# Patient Record
Sex: Male | Born: 1980 | Race: White | Hispanic: No | Marital: Married | Smoking: Never smoker
Health system: Southern US, Community
[De-identification: ages and names within clinical notes are randomized; demographics above are authoritative.]

## PROBLEM LIST (undated history)

## (undated) DIAGNOSIS — K219 Gastro-esophageal reflux disease without esophagitis: Secondary | ICD-10-CM

## (undated) DIAGNOSIS — S82141A Displaced bicondylar fracture of right tibia, initial encounter for closed fracture: Secondary | ICD-10-CM

## (undated) DIAGNOSIS — F419 Anxiety disorder, unspecified: Secondary | ICD-10-CM

## (undated) HISTORY — PX: TONSILLECTOMY AND ADENOIDECTOMY: SHX28

---

## 2015-08-30 ENCOUNTER — Emergency Department (HOSPITAL_COMMUNITY): Payer: Self-pay

## 2015-08-30 ENCOUNTER — Encounter (HOSPITAL_COMMUNITY): Payer: Self-pay

## 2015-08-30 ENCOUNTER — Inpatient Hospital Stay (HOSPITAL_COMMUNITY)
Admission: EM | Admit: 2015-08-30 | Discharge: 2015-09-06 | DRG: 494 | Disposition: A | Discharge status: Home Health | Payer: Self-pay | Attending: Orthopedic Surgery | Admitting: Orthopedic Surgery

## 2015-08-30 DIAGNOSIS — Z419 Encounter for procedure for purposes other than remedying health state, unspecified: Secondary | ICD-10-CM

## 2015-08-30 DIAGNOSIS — S8991XA Unspecified injury of right lower leg, initial encounter: Secondary | ICD-10-CM

## 2015-08-30 DIAGNOSIS — S82141A Displaced bicondylar fracture of right tibia, initial encounter for closed fracture: Principal | ICD-10-CM

## 2015-08-30 DIAGNOSIS — Z01818 Encounter for other preprocedural examination: Secondary | ICD-10-CM

## 2015-08-30 DIAGNOSIS — S82143A Displaced bicondylar fracture of unspecified tibia, initial encounter for closed fracture: Secondary | ICD-10-CM | POA: Diagnosis present

## 2015-08-30 DIAGNOSIS — K219 Gastro-esophageal reflux disease without esophagitis: Secondary | ICD-10-CM | POA: Diagnosis present

## 2015-08-30 DIAGNOSIS — F419 Anxiety disorder, unspecified: Secondary | ICD-10-CM | POA: Diagnosis present

## 2015-08-30 DIAGNOSIS — S82131A Displaced fracture of medial condyle of right tibia, initial encounter for closed fracture: Secondary | ICD-10-CM | POA: Diagnosis present

## 2015-08-30 DIAGNOSIS — F17290 Nicotine dependence, other tobacco product, uncomplicated: Secondary | ICD-10-CM | POA: Diagnosis present

## 2015-08-30 HISTORY — DX: Gastro-esophageal reflux disease without esophagitis: K21.9

## 2015-08-30 HISTORY — DX: Displaced bicondylar fracture of right tibia, initial encounter for closed fracture: S82.141A

## 2015-08-30 HISTORY — DX: Anxiety disorder, unspecified: F41.9

## 2015-08-30 LAB — BASIC METABOLIC PANEL
ANION GAP: 11 (ref 5–15)
BUN: 22 mg/dL — AB (ref 6–20)
CHLORIDE: 105 mmol/L (ref 101–111)
CO2: 25 mmol/L (ref 22–32)
Calcium: 9.3 mg/dL (ref 8.9–10.3)
Creatinine, Ser: 1.07 mg/dL (ref 0.61–1.24)
GFR calc Af Amer: >60 mL/min (ref >60)
GFR calc non Af Amer: >60 mL/min (ref >60)
GLUCOSE: 117 mg/dL — AB (ref 65–99)
POTASSIUM: 3.8 mmol/L (ref 3.5–5.1)
Sodium: 141 mmol/L (ref 135–145)

## 2015-08-30 LAB — CBC WITH DIFFERENTIAL/PLATELET
Basophils Absolute: 0 10*3/uL (ref 0.0–0.1)
Basophils Relative: 0 %
Eosinophils Absolute: 0 10*3/uL (ref 0.0–0.7)
Eosinophils Relative: 0 %
HEMATOCRIT: 41.3 % (ref 39.0–52.0)
HEMOGLOBIN: 14.7 g/dL (ref 13.0–17.0)
LYMPHS ABS: 1 10*3/uL (ref 0.7–4.0)
LYMPHS PCT: 8 %
MCH: 30.1 pg (ref 26.0–34.0)
MCHC: 35.6 g/dL (ref 30.0–36.0)
MCV: 84.6 fL (ref 78.0–100.0)
Monocytes Absolute: 0.5 10*3/uL (ref 0.1–1.0)
Monocytes Relative: 4 %
NEUTROS ABS: 10.8 10*3/uL — AB (ref 1.7–7.7)
NEUTROS PCT: 88 %
Platelets: 234 10*3/uL (ref 150–400)
RBC: 4.88 MIL/uL (ref 4.22–5.81)
RDW: 11.8 % (ref 11.5–15.5)
WBC: 12.2 10*3/uL — AB (ref 4.0–10.5)

## 2015-08-30 MED ORDER — OXYCODONE HCL 5 MG PO TABS
5.0000 mg | ORAL_TABLET | ORAL | Status: DC | PRN
  Administered 2015-08-31: 10 mg via ORAL
  Administered 2015-08-31 (×2): 5 mg via ORAL
  Administered 2015-08-31: 10 mg via ORAL
  Administered 2015-09-01 (×2): 5 mg via ORAL
  Administered 2015-09-01 – 2015-09-06 (×21): 10 mg via ORAL
  Filled 2015-08-30 (×3): qty 2
  Filled 2015-08-30: qty 1
  Filled 2015-08-30 (×5): qty 2
  Filled 2015-08-30 (×2): qty 1
  Filled 2015-08-30 (×12): qty 2
  Filled 2015-08-30 (×2): qty 1
  Filled 2015-08-30 (×2): qty 2
  Filled 2015-08-30: qty 1
  Filled 2015-08-30: qty 2

## 2015-08-30 MED ORDER — HYDROMORPHONE HCL 1 MG/ML IJ SOLN
0.5000 mg | INTRAMUSCULAR | Status: DC | PRN
  Filled 2015-08-30: qty 1

## 2015-08-30 MED ORDER — ONDANSETRON HCL 4 MG/2ML IJ SOLN
4.0000 mg | Freq: Once | INTRAMUSCULAR | Status: AC | PRN
  Administered 2015-08-30: 4 mg via INTRAVENOUS
  Filled 2015-08-30: qty 2

## 2015-08-30 MED ORDER — MORPHINE SULFATE (PF) 4 MG/ML IV SOLN
4.0000 mg | Freq: Once | INTRAVENOUS | Status: AC
  Administered 2015-08-30: 4 mg via INTRAVENOUS
  Filled 2015-08-30: qty 1

## 2015-08-30 MED ORDER — ACETAMINOPHEN 650 MG RE SUPP: 650.0000 mg | Freq: Four times a day (QID) | RECTAL | Status: DC | PRN

## 2015-08-30 MED ORDER — SODIUM CHLORIDE 0.9 % IV SOLN
INTRAVENOUS | Status: DC
  Administered 2015-08-30: 22:00:00 via INTRAVENOUS

## 2015-08-30 MED ORDER — HYDROMORPHONE HCL 1 MG/ML IJ SOLN
1.0000 mg | INTRAMUSCULAR | Status: DC | PRN
  Administered 2015-08-30 – 2015-08-31 (×3): 1 mg via INTRAVENOUS
  Filled 2015-08-30 (×3): qty 1

## 2015-08-30 MED ORDER — DOCUSATE SODIUM 100 MG PO CAPS
100.0000 mg | ORAL_CAPSULE | Freq: Two times a day (BID) | ORAL | Status: DC
  Administered 2015-08-30 – 2015-08-31 (×2): 100 mg via ORAL
  Filled 2015-08-30 (×2): qty 1

## 2015-08-30 MED ORDER — NICOTINE 7 MG/24HR TD PT24
7.0000 mg | MEDICATED_PATCH | Freq: Every day | TRANSDERMAL | Status: DC
  Administered 2015-08-30: 7 mg via TRANSDERMAL
  Filled 2015-08-30 (×2): qty 1

## 2015-08-30 MED ORDER — ONDANSETRON HCL 4 MG/2ML IJ SOLN
4.0000 mg | Freq: Three times a day (TID) | INTRAMUSCULAR | Status: DC | PRN
  Administered 2015-08-31: 4 mg via INTRAVENOUS
  Filled 2015-08-30: qty 2

## 2015-08-30 MED ORDER — MORPHINE SULFATE (PF) 2 MG/ML IV SOLN
2.0000 mg | Freq: Once | INTRAVENOUS | Status: AC
  Administered 2015-08-30: 2 mg via INTRAVENOUS
  Filled 2015-08-30: qty 1

## 2015-08-30 MED ORDER — ACETAMINOPHEN 325 MG PO TABS: 650.0000 mg | ORAL_TABLET | Freq: Four times a day (QID) | ORAL | Status: DC | PRN

## 2015-08-30 MED ORDER — SENNA 8.6 MG PO TABS
1.0000 | ORAL_TABLET | Freq: Two times a day (BID) | ORAL | Status: DC
  Administered 2015-08-30 – 2015-09-06 (×12): 8.6 mg via ORAL
  Filled 2015-08-30 (×12): qty 1

## 2015-08-30 NOTE — ED Notes (Signed)
[facility]
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
## 2015-08-31 ENCOUNTER — Encounter (HOSPITAL_COMMUNITY): Payer: Self-pay | Admitting: Orthopedic Surgery

## 2015-08-31 DIAGNOSIS — S82143A Displaced bicondylar fracture of unspecified tibia, initial encounter for closed fracture: Secondary | ICD-10-CM | POA: Diagnosis present

## 2015-08-31 LAB — CREATININE, SERUM
Creatinine, Ser: 0.99 mg/dL (ref 0.61–1.24)
GFR calc non Af Amer: >60 mL/min (ref >60)

## 2015-08-31 LAB — ABO/RH: ABO/RH(D): A POS

## 2015-08-31 LAB — CBC
HEMATOCRIT: 40.4 % (ref 39.0–52.0)
HEMATOCRIT: 40.1 % (ref 39.0–52.0)
HEMOGLOBIN: 13.4 g/dL (ref 13.0–17.0)
Hemoglobin: 13.3 g/dL (ref 13.0–17.0)
MCH: 29.3 pg (ref 26.0–34.0)
MCH: 29.2 pg (ref 26.0–34.0)
MCHC: 33.2 g/dL (ref 30.0–36.0)
MCHC: 33.2 g/dL (ref 30.0–36.0)
MCV: 88.2 fL (ref 78.0–100.0)
MCV: 87.9 fL (ref 78.0–100.0)
Platelets: 208 10*3/uL (ref 150–400)
Platelets: 185 10*3/uL (ref 150–400)
RBC: 4.58 MIL/uL (ref 4.22–5.81)
RBC: 4.56 MIL/uL (ref 4.22–5.81)
RDW: 12.2 % (ref 11.5–15.5)
RDW: 12.1 % (ref 11.5–15.5)
WBC: 8.7 10*3/uL (ref 4.0–10.5)
WBC: 8.4 10*3/uL (ref 4.0–10.5)

## 2015-08-31 LAB — BASIC METABOLIC PANEL
ANION GAP: 11 (ref 5–15)
BUN: 18 mg/dL (ref 6–20)
CALCIUM: 8.9 mg/dL (ref 8.9–10.3)
CHLORIDE: 101 mmol/L (ref 101–111)
CO2: 26 mmol/L (ref 22–32)
CREATININE: 1.09 mg/dL (ref 0.61–1.24)
GFR calc non Af Amer: >60 mL/min (ref >60)
Glucose, Bld: 111 mg/dL — ABNORMAL HIGH (ref 65–99)
Potassium: 3.7 mmol/L (ref 3.5–5.1)
SODIUM: 138 mmol/L (ref 135–145)

## 2015-08-31 LAB — SURGICAL PCR SCREEN
MRSA, PCR: NEGATIVE
STAPHYLOCOCCUS AUREUS: NEGATIVE

## 2015-08-31 MED ORDER — HYDROXYZINE HCL 25 MG PO TABS: 25.0000 mg | ORAL_TABLET | Freq: Three times a day (TID) | ORAL | Status: DC | PRN

## 2015-08-31 MED ORDER — ACETAMINOPHEN 650 MG RE SUPP: 650.0000 mg | Freq: Four times a day (QID) | RECTAL | Status: DC | PRN

## 2015-08-31 MED ORDER — POLYETHYLENE GLYCOL 3350 17 G PO PACK
17.0000 g | PACK | Freq: Every day | ORAL | Status: DC
  Administered 2015-08-31 – 2015-09-06 (×6): 17 g via ORAL
  Filled 2015-08-31 (×6): qty 1

## 2015-08-31 MED ORDER — OXYCODONE-ACETAMINOPHEN 7.5-325 MG PO TABS
1.0000 | ORAL_TABLET | Freq: Four times a day (QID) | ORAL | Status: DC | PRN
  Administered 2015-09-01: 2 via ORAL
  Administered 2015-09-01 – 2015-09-02 (×4): 1 via ORAL
  Administered 2015-09-03 – 2015-09-06 (×11): 2 via ORAL
  Filled 2015-08-31: qty 2
  Filled 2015-08-31: qty 1
  Filled 2015-08-31 (×3): qty 2
  Filled 2015-08-31: qty 1
  Filled 2015-08-31 (×3): qty 2
  Filled 2015-08-31: qty 1
  Filled 2015-08-31 (×5): qty 2
  Filled 2015-08-31 (×2): qty 1
  Filled 2015-08-31: qty 2

## 2015-08-31 MED ORDER — METHOCARBAMOL 500 MG PO TABS
1000.0000 mg | ORAL_TABLET | Freq: Four times a day (QID) | ORAL | Status: DC
  Administered 2015-08-31 – 2015-09-06 (×18): 1000 mg via ORAL
  Filled 2015-08-31 (×19): qty 2

## 2015-08-31 MED ORDER — HYDROMORPHONE HCL 1 MG/ML IJ SOLN
1.0000 mg | INTRAMUSCULAR | Status: DC | PRN
  Administered 2015-08-31: 1 mg via INTRAVENOUS
  Administered 2015-09-01 – 2015-09-03 (×2): 2 mg via INTRAVENOUS
  Administered 2015-09-03 – 2015-09-05 (×6): 1 mg via INTRAVENOUS
  Filled 2015-08-31 (×2): qty 1
  Filled 2015-08-31: qty 2
  Filled 2015-08-31: qty 1
  Filled 2015-08-31: qty 2
  Filled 2015-08-31 (×2): qty 1
  Filled 2015-08-31: qty 2
  Filled 2015-08-31: qty 1

## 2015-08-31 MED ORDER — METHOCARBAMOL 1000 MG/10ML IJ SOLN
500.0000 mg | Freq: Four times a day (QID) | INTRAVENOUS | Status: DC
  Administered 2015-09-01: 500 mg via INTRAVENOUS
  Filled 2015-08-31 (×28): qty 5

## 2015-08-31 MED ORDER — POTASSIUM CHLORIDE IN NACL 20-0.9 MEQ/L-% IV SOLN
INTRAVENOUS | Status: DC
  Administered 2015-08-31 – 2015-09-05 (×7): via INTRAVENOUS
  Filled 2015-08-31 (×8): qty 1000

## 2015-08-31 MED ORDER — BISACODYL 5 MG PO TBEC: 5.0000 mg | DELAYED_RELEASE_TABLET | Freq: Every day | ORAL | Status: DC | PRN

## 2015-08-31 MED ORDER — ENOXAPARIN SODIUM 40 MG/0.4ML ~~LOC~~ SOLN
40.0000 mg | SUBCUTANEOUS | Status: DC
  Administered 2015-08-31 – 2015-09-02 (×3): 40 mg via SUBCUTANEOUS
  Filled 2015-08-31 (×3): qty 0.4

## 2015-08-31 MED ORDER — PANTOPRAZOLE SODIUM 40 MG PO TBEC
40.0000 mg | DELAYED_RELEASE_TABLET | Freq: Every day | ORAL | Status: DC
  Administered 2015-08-31 – 2015-09-06 (×6): 40 mg via ORAL
  Filled 2015-08-31 (×6): qty 1

## 2015-08-31 MED ORDER — VITAMIN C 500 MG PO TABS
500.0000 mg | ORAL_TABLET | Freq: Every day | ORAL | Status: DC
  Administered 2015-08-31 – 2015-09-06 (×6): 500 mg via ORAL
  Filled 2015-08-31 (×6): qty 1

## 2015-08-31 MED ORDER — FLUOXETINE HCL 20 MG PO CAPS
20.0000 mg | ORAL_CAPSULE | Freq: Every day | ORAL | Status: DC
  Administered 2015-08-31 – 2015-09-06 (×6): 20 mg via ORAL
  Filled 2015-08-31 (×6): qty 1

## 2015-08-31 MED ORDER — ACETAMINOPHEN 325 MG PO TABS: 650.0000 mg | ORAL_TABLET | Freq: Four times a day (QID) | ORAL | Status: DC | PRN

## 2015-08-31 MED ORDER — DOCUSATE SODIUM 100 MG PO CAPS
100.0000 mg | ORAL_CAPSULE | Freq: Two times a day (BID) | ORAL | Status: DC
  Administered 2015-08-31 – 2015-09-06 (×11): 100 mg via ORAL
  Filled 2015-08-31 (×11): qty 1

## 2015-08-31 MED ORDER — ONDANSETRON HCL 4 MG PO TABS: 4.0000 mg | ORAL_TABLET | Freq: Four times a day (QID) | ORAL | Status: DC | PRN

## 2015-08-31 MED ORDER — ONDANSETRON HCL 4 MG/2ML IJ SOLN: 4.0000 mg | Freq: Four times a day (QID) | INTRAMUSCULAR | Status: DC | PRN
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
## 2015-08-31 NOTE — Progress Notes (Signed)
Utilization review completed.  

## 2015-08-31 NOTE — Progress Notes (Signed)
   Subjective:  Patient reports pain as moderate.  Pain controlled with PO meds.  Objective:   VITALS:   Filed Vitals:   08/30/15 1829 08/30/15 1901 08/30/15 2110 08/31/15 0456  BP:  121/61 102/59 127/72  Pulse:  69 75 74  Temp:  98.7 F (37.1 C) 98.7 F (37.1 C) 98.8 F (37.1 C)  TempSrc:  Oral Oral Oral  Resp:  18 18 16   Height:   6\' 1"  (1.854 m)   Weight: 124.739 kg (275 lb)     SpO2:  99% 95% 96%    Sensation intact distally Intact pulses distally Dorsiflexion/Plantar flexion intact Compartment soft No pain with passive stretch  Lab Results  Component Value Date   WBC 8.7 08/31/2015   HGB 13.4 08/31/2015   HCT 40.4 08/31/2015   MCV 88.2 08/31/2015   PLT 208 08/31/2015   BMET    Component Value Date/Time   NA 138 08/31/2015 0357   K 3.7 08/31/2015 0357   CL 101 08/31/2015 0357   CO2 26 08/31/2015 0357   GLUCOSE 111* 08/31/2015 0357   BUN 18 08/31/2015 0357   CREATININE 1.09 08/31/2015 0357   CALCIUM 8.9 08/31/2015 0357   GFRNONAA >60 08/31/2015 0357   GFRAA >60 08/31/2015 0357     Assessment/Plan:     Active Problems:   Tibial plateau fracture, right   Right medial tibial plateau fracture   NWB RLE Knee immobilizer Ice, elevation NPO for now Cont compartment checks: no evidence of impending compartment syndrome Dr. Carola FrostHandy to take over today    Glennie Rodda, Cloyde ReamsBrian James 08/31/2015, 7:53 AM   Samson FredericBrian Yanis Larin, MD Cell [phone]

## 2015-09-01 LAB — TYPE AND SCREEN
ABO/RH(D): A POS
Antibody Screen: NEGATIVE

## 2015-09-01 NOTE — Progress Notes (Signed)
Subjective:   Procedure(s) (LRB): OPEN REDUCTION INTERNAL FIXATION (ORIF) TIBIAL PLATEAU (Right) Patient reports pain as 2 on 0-10 scale. Doing fine. He is on DR. Handy's service. Will have surgery on Monday.   Objective: Vital signs in last 24 hours: Temp:  [98.8 F (37.1 C)-99.2 F (37.3 C)] 98.8 F (37.1 C) (04/08 0457) Pulse Rate:  [63-74] 63 (04/08 0457) Resp:  [18] 18 (04/08 0457) BP: (121-137)/(72-75) 137/72 mmHg (04/08 0457) SpO2:  [97 %] 97 % (04/08 0457)  Intake/Output from previous day: 04/07 0701 - 04/08 0700 In: 1421.7 [P.O.:1050; I.V.:371.7] Out: -  Intake/Output this shift: Total I/O In: 240 [P.O.:240] Out: -    Recent Labs  08/30/15 1527 08/31/15 0357 08/31/15 0955  HGB 14.7 13.4 13.3    Recent Labs  08/31/15 0357 08/31/15 0955  WBC 8.7 8.4  RBC 4.58 4.56  HCT 40.4 40.1  PLT 208 185    Recent Labs  08/30/15 1527 08/31/15 0357 08/31/15 0955  NA 141 138  --   K 3.8 3.7  --   CL 105 101  --   CO2 25 26  --   BUN 22* 18  --   CREATININE 1.07 1.09 0.99  GLUCOSE 117* 111*  --   CALCIUM 9.3 8.9  --    No results for input(s): LABPT, INR in the last 72 hours.  Neurologically intact  Assessment/Plan:   Procedure(s) (LRB): OPEN REDUCTION INTERNAL FIXATION (ORIF) TIBIAL PLATEAU (Right) Up with therapy  Jeffie Widdowson A 09/01/2015, 9:29 AM
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
## 2015-09-02 ENCOUNTER — Inpatient Hospital Stay (HOSPITAL_COMMUNITY): Payer: Self-pay

## 2015-09-02 MED ORDER — CHLORHEXIDINE GLUCONATE 4 % EX LIQD
60.0000 mL | Freq: Once | CUTANEOUS | Status: AC
  Administered 2015-09-03: 4 via TOPICAL
  Filled 2015-09-02: qty 60

## 2015-09-02 MED ORDER — POVIDONE-IODINE 10 % EX SWAB: 2.0000 "application " | Freq: Once | CUTANEOUS | Status: DC

## 2015-09-02 MED ORDER — DEXTROSE 5 % IV SOLN
3.0000 g | INTRAVENOUS | Status: AC
  Administered 2015-09-03: 3 g via INTRAVENOUS
  Filled 2015-09-02 (×3): qty 3000
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
## 2015-09-03 ENCOUNTER — Inpatient Hospital Stay (HOSPITAL_COMMUNITY): Payer: MEDICAID | Admitting: Certified Registered Nurse Anesthetist

## 2015-09-03 ENCOUNTER — Encounter (HOSPITAL_COMMUNITY): Payer: Self-pay | Admitting: Anesthesiology

## 2015-09-03 ENCOUNTER — Inpatient Hospital Stay (HOSPITAL_COMMUNITY): Payer: Self-pay | Admitting: Certified Registered Nurse Anesthetist

## 2015-09-03 ENCOUNTER — Inpatient Hospital Stay (HOSPITAL_COMMUNITY): Payer: Self-pay

## 2015-09-03 ENCOUNTER — Inpatient Hospital Stay (HOSPITAL_COMMUNITY): Payer: MEDICAID

## 2015-09-03 ENCOUNTER — Encounter (HOSPITAL_COMMUNITY)
Admission: EM | Disposition: A | Discharge status: Home Health | Payer: Self-pay | Source: Home / Self Care | Attending: Orthopedic Surgery

## 2015-09-03 HISTORY — PX: ORIF TIBIA PLATEAU: SHX2132

## 2015-09-03 LAB — CBC WITH DIFFERENTIAL/PLATELET
Basophils Absolute: 0 10*3/uL (ref 0.0–0.1)
Basophils Relative: 0 %
Eosinophils Absolute: 0.2 10*3/uL (ref 0.0–0.7)
Eosinophils Relative: 3 %
HCT: 38.8 % — ABNORMAL LOW (ref 39.0–52.0)
HEMOGLOBIN: 12.9 g/dL — AB (ref 13.0–17.0)
LYMPHS ABS: 1.4 10*3/uL (ref 0.7–4.0)
LYMPHS PCT: 22 %
MCH: 29.3 pg (ref 26.0–34.0)
MCHC: 33.2 g/dL (ref 30.0–36.0)
MCV: 88.2 fL (ref 78.0–100.0)
Monocytes Absolute: 0.4 10*3/uL (ref 0.1–1.0)
Monocytes Relative: 7 %
NEUTROS PCT: 68 %
Neutro Abs: 4.5 10*3/uL (ref 1.7–7.7)
Platelets: 179 10*3/uL (ref 150–400)
RBC: 4.4 MIL/uL (ref 4.22–5.81)
RDW: 11.8 % (ref 11.5–15.5)
WBC: 6.6 10*3/uL (ref 4.0–10.5)

## 2015-09-03 LAB — CBC
HCT: 37.8 % — ABNORMAL LOW (ref 39.0–52.0)
Hemoglobin: 13 g/dL (ref 13.0–17.0)
MCH: 30.2 pg (ref 26.0–34.0)
MCHC: 34.4 g/dL (ref 30.0–36.0)
MCV: 87.9 fL (ref 78.0–100.0)
PLATELETS: 205 10*3/uL (ref 150–400)
RBC: 4.3 MIL/uL (ref 4.22–5.81)
RDW: 11.8 % (ref 11.5–15.5)
WBC: 10.9 10*3/uL — AB (ref 4.0–10.5)

## 2015-09-03 LAB — COMPREHENSIVE METABOLIC PANEL
ALK PHOS: 55 U/L (ref 38–126)
ALT: 43 U/L (ref 17–63)
AST: 34 U/L (ref 15–41)
Albumin: 3.4 g/dL — ABNORMAL LOW (ref 3.5–5.0)
Anion gap: 12 (ref 5–15)
BUN: 8 mg/dL (ref 6–20)
CALCIUM: 9.1 mg/dL (ref 8.9–10.3)
CO2: 26 mmol/L (ref 22–32)
CREATININE: 0.98 mg/dL (ref 0.61–1.24)
Chloride: 100 mmol/L — ABNORMAL LOW (ref 101–111)
GFR calc non Af Amer: >60 mL/min (ref >60)
Glucose, Bld: 116 mg/dL — ABNORMAL HIGH (ref 65–99)
Potassium: 3.9 mmol/L (ref 3.5–5.1)
SODIUM: 138 mmol/L (ref 135–145)
Total Bilirubin: 2.7 mg/dL — ABNORMAL HIGH (ref 0.3–1.2)
Total Protein: 6.5 g/dL (ref 6.5–8.1)

## 2015-09-03 LAB — CREATININE, SERUM: Creatinine, Ser: 0.93 mg/dL (ref 0.61–1.24)

## 2015-09-03 LAB — PROTIME-INR
INR: 1.03 (ref 0.00–1.49)
Prothrombin Time: 13.7 seconds (ref 11.6–15.2)

## 2015-09-03 LAB — TYPE AND SCREEN
ABO/RH(D): A POS
Antibody Screen: NEGATIVE

## 2015-09-03 LAB — VITAMIN D 25 HYDROXY (VIT D DEFICIENCY, FRACTURES): VIT D 25 HYDROXY: 36.1 ng/mL (ref 30.0–100.0)

## 2015-09-03 LAB — APTT: aPTT: 27 seconds (ref 24–37)

## 2015-09-03 SURGERY — OPEN REDUCTION INTERNAL FIXATION (ORIF) TIBIAL PLATEAU
Anesthesia: General | Laterality: Right

## 2015-09-03 MED ORDER — MIDAZOLAM HCL 2 MG/2ML IJ SOLN
INTRAMUSCULAR | Status: AC
  Filled 2015-09-03: qty 2

## 2015-09-03 MED ORDER — PROPOFOL 10 MG/ML IV BOLUS
INTRAVENOUS | Status: DC | PRN
  Administered 2015-09-03: 200 mg via INTRAVENOUS

## 2015-09-03 MED ORDER — LIDOCAINE HCL (CARDIAC) 20 MG/ML IV SOLN
INTRAVENOUS | Status: DC | PRN
  Administered 2015-09-03: 50 mg via INTRAVENOUS

## 2015-09-03 MED ORDER — HEMOSTATIC AGENTS (NO CHARGE) OPTIME
TOPICAL | Status: DC | PRN
  Administered 2015-09-03 (×2): 1 via TOPICAL

## 2015-09-03 MED ORDER — BUPIVACAINE-EPINEPHRINE (PF) 0.25% -1:200000 IJ SOLN
INTRAMUSCULAR | Status: AC
  Filled 2015-09-03: qty 30

## 2015-09-03 MED ORDER — NEOSTIGMINE METHYLSULFATE 10 MG/10ML IV SOLN
INTRAVENOUS | Status: DC | PRN
  Administered 2015-09-03: 2 mg via INTRAVENOUS

## 2015-09-03 MED ORDER — METOCLOPRAMIDE HCL 5 MG PO TABS: 5.0000 mg | ORAL_TABLET | Freq: Three times a day (TID) | ORAL | Status: DC | PRN

## 2015-09-03 MED ORDER — ONDANSETRON HCL 4 MG/2ML IJ SOLN
INTRAMUSCULAR | Status: DC | PRN
  Administered 2015-09-03: 4 mg via INTRAVENOUS

## 2015-09-03 MED ORDER — ONDANSETRON HCL 4 MG/2ML IJ SOLN
INTRAMUSCULAR | Status: AC
  Filled 2015-09-03: qty 2

## 2015-09-03 MED ORDER — HYDROMORPHONE HCL 1 MG/ML IJ SOLN
0.2500 mg | INTRAMUSCULAR | Status: DC | PRN
  Administered 2015-09-03: 0.5 mg via INTRAVENOUS

## 2015-09-03 MED ORDER — ACETAMINOPHEN 325 MG PO TABS: 650.0000 mg | ORAL_TABLET | Freq: Four times a day (QID) | ORAL | Status: DC | PRN

## 2015-09-03 MED ORDER — ROCURONIUM BROMIDE 50 MG/5ML IV SOLN
INTRAVENOUS | Status: AC
  Filled 2015-09-03: qty 1

## 2015-09-03 MED ORDER — PROPOFOL 10 MG/ML IV BOLUS
INTRAVENOUS | Status: AC
  Filled 2015-09-03: qty 40

## 2015-09-03 MED ORDER — 0.9 % SODIUM CHLORIDE (POUR BTL) OPTIME
TOPICAL | Status: DC | PRN
  Administered 2015-09-03: 1000 mL

## 2015-09-03 MED ORDER — HYDROMORPHONE HCL 1 MG/ML IJ SOLN
INTRAMUSCULAR | Status: AC
  Filled 2015-09-03: qty 1

## 2015-09-03 MED ORDER — OXYCODONE HCL 5 MG/5ML PO SOLN: 5.0000 mg | Freq: Once | ORAL | Status: AC | PRN

## 2015-09-03 MED ORDER — BUPIVACAINE-EPINEPHRINE 0.5% -1:200000 IJ SOLN
INTRAMUSCULAR | Status: DC | PRN
Start: 2015-09-03 — End: 2015-09-03
  Administered 2015-09-03: 9 mL

## 2015-09-03 MED ORDER — ROCURONIUM BROMIDE 100 MG/10ML IV SOLN
INTRAVENOUS | Status: DC | PRN
  Administered 2015-09-03 (×7): 10 mg via INTRAVENOUS
  Administered 2015-09-03: 50 mg via INTRAVENOUS
  Administered 2015-09-03: 10 mg via INTRAVENOUS

## 2015-09-03 MED ORDER — OXYCODONE HCL 5 MG PO TABS
ORAL_TABLET | ORAL | Status: AC
  Filled 2015-09-03: qty 1

## 2015-09-03 MED ORDER — HYDROMORPHONE HCL 1 MG/ML IJ SOLN
INTRAMUSCULAR | Status: DC | PRN
  Administered 2015-09-03: 1 mg via INTRAVENOUS

## 2015-09-03 MED ORDER — MIDAZOLAM HCL 5 MG/5ML IJ SOLN
INTRAMUSCULAR | Status: DC | PRN
  Administered 2015-09-03: 2 mg via INTRAVENOUS

## 2015-09-03 MED ORDER — SODIUM CHLORIDE 0.9 % IR SOLN
Status: DC | PRN
  Administered 2015-09-03 (×2): 1000 mL

## 2015-09-03 MED ORDER — GLYCOPYRROLATE 0.2 MG/ML IJ SOLN
INTRAMUSCULAR | Status: DC | PRN
  Administered 2015-09-03: 0.4 mg via INTRAVENOUS

## 2015-09-03 MED ORDER — LACTATED RINGERS IV SOLN
INTRAVENOUS | Status: DC
  Administered 2015-09-03 (×4): via INTRAVENOUS

## 2015-09-03 MED ORDER — FENTANYL CITRATE (PF) 100 MCG/2ML IJ SOLN
INTRAMUSCULAR | Status: DC | PRN
  Administered 2015-09-03 (×10): 50 ug via INTRAVENOUS

## 2015-09-03 MED ORDER — ONDANSETRON HCL 4 MG/2ML IJ SOLN
4.0000 mg | Freq: Once | INTRAMUSCULAR | Status: AC | PRN
  Administered 2015-09-03: 4 mg via INTRAVENOUS

## 2015-09-03 MED ORDER — OXYCODONE HCL 5 MG PO TABS
5.0000 mg | ORAL_TABLET | Freq: Once | ORAL | Status: AC | PRN
  Administered 2015-09-03: 5 mg via ORAL

## 2015-09-03 MED ORDER — ONDANSETRON HCL 4 MG/2ML IJ SOLN: 4.0000 mg | Freq: Four times a day (QID) | INTRAMUSCULAR | Status: DC | PRN

## 2015-09-03 MED ORDER — ONDANSETRON HCL 4 MG PO TABS: 4.0000 mg | ORAL_TABLET | Freq: Four times a day (QID) | ORAL | Status: DC | PRN

## 2015-09-03 MED ORDER — BUPIVACAINE-EPINEPHRINE (PF) 0.5% -1:200000 IJ SOLN
INTRAMUSCULAR | Status: AC
  Filled 2015-09-03: qty 30

## 2015-09-03 MED ORDER — FENTANYL CITRATE (PF) 250 MCG/5ML IJ SOLN
INTRAMUSCULAR | Status: AC
  Filled 2015-09-03: qty 5

## 2015-09-03 MED ORDER — ENOXAPARIN SODIUM 40 MG/0.4ML ~~LOC~~ SOLN
40.0000 mg | SUBCUTANEOUS | Status: DC
  Administered 2015-09-04 – 2015-09-06 (×3): 40 mg via SUBCUTANEOUS
  Filled 2015-09-03 (×3): qty 0.4

## 2015-09-03 MED ORDER — METOCLOPRAMIDE HCL 5 MG/ML IJ SOLN: 5.0000 mg | Freq: Three times a day (TID) | INTRAMUSCULAR | Status: DC | PRN

## 2015-09-03 MED ORDER — ACETAMINOPHEN 650 MG RE SUPP: 650.0000 mg | Freq: Four times a day (QID) | RECTAL | Status: DC | PRN

## 2015-09-03 MED ORDER — CEFAZOLIN SODIUM-DEXTROSE 2-4 GM/100ML-% IV SOLN
2.0000 g | Freq: Four times a day (QID) | INTRAVENOUS | Status: AC
  Administered 2015-09-03 – 2015-09-04 (×3): 2 g via INTRAVENOUS
  Filled 2015-09-03 (×4): qty 100

## 2015-09-03 MED ORDER — FENTANYL CITRATE (PF) 250 MCG/5ML IJ SOLN
INTRAMUSCULAR | Status: AC
Start: 2015-09-03 — End: 2015-09-03
  Filled 2015-09-03: qty 5

## 2015-09-03 SURGICAL SUPPLY — 115 items
BANDAGE ELASTIC 4 VELCRO ST LF (GAUZE/BANDAGES/DRESSINGS) ×3 IMPLANT
BANDAGE ELASTIC 6 VELCRO ST LF (GAUZE/BANDAGES/DRESSINGS) ×6 IMPLANT
BANDAGE ESMARK 6X9 LF (GAUZE/BANDAGES/DRESSINGS) ×1 IMPLANT
BENZOIN TINCTURE PRP APPL 2/3 (GAUZE/BANDAGES/DRESSINGS) ×3 IMPLANT
BIT DRILL 100X2.5XANTM LCK (BIT) ×1 IMPLANT
BIT DRILL 3.5X5.5 QC CALB (BIT) ×3 IMPLANT
BIT DRL 100X2.5XANTM LCK (BIT) ×1
BLADE CLIPPER SURG (BLADE) ×3 IMPLANT
BLADE SURG 10 STRL SS (BLADE) ×3 IMPLANT
BLADE SURG 15 STRL LF DISP TIS (BLADE) ×1 IMPLANT
BLADE SURG 15 STRL SS (BLADE) ×2
BLADE SURG ROTATE 9660 (MISCELLANEOUS) IMPLANT
BNDG COHESIVE 4X5 TAN STRL (GAUZE/BANDAGES/DRESSINGS) ×3 IMPLANT
BNDG ESMARK 6X9 LF (GAUZE/BANDAGES/DRESSINGS) ×3
BNDG GAUZE ELAST 4 BULKY (GAUZE/BANDAGES/DRESSINGS) ×3 IMPLANT
BONE CANC CHIPS 20CC PCAN1/4 (Bone Implant) ×3 IMPLANT
BRUSH SCRUB DISP (MISCELLANEOUS) ×6 IMPLANT
CANISTER SUCT 3000ML PPV (MISCELLANEOUS) ×3 IMPLANT
CHIPS CANC BONE 20CC PCAN1/4 (Bone Implant) ×1 IMPLANT
CLOSURE STERI-STRIP 1/2X4 (GAUZE/BANDAGES/DRESSINGS) ×1
CLSR STERI-STRIP ANTIMIC 1/2X4 (GAUZE/BANDAGES/DRESSINGS) ×2 IMPLANT
CONT SPEC 4OZ CLIKSEAL STRL BL (MISCELLANEOUS) ×3 IMPLANT
COVER MAYO STAND STRL (DRAPES) ×3 IMPLANT
COVER SURGICAL LIGHT HANDLE (MISCELLANEOUS) ×3 IMPLANT
CUFF TOURNIQUET SINGLE 34IN LL (TOURNIQUET CUFF) IMPLANT
DRAPE C-ARM 42X72 X-RAY (DRAPES) ×3 IMPLANT
DRAPE C-ARMOR (DRAPES) ×3 IMPLANT
DRAPE EXTREMITY T 121X128X90 (DRAPE) IMPLANT
DRAPE INCISE IOBAN 66X45 STRL (DRAPES) ×6 IMPLANT
DRAPE ORTHO SPLIT 77X108 STRL (DRAPES) ×4
DRAPE SURG ORHT 6 SPLT 77X108 (DRAPES) ×2 IMPLANT
DRAPE U-SHAPE 47X51 STRL (DRAPES) ×3 IMPLANT
DRILL BIT 2.5MM (BIT) ×2
DRILL BIT 2.7X100 214235006 DU (MISCELLANEOUS) IMPLANT
DRSG ADAPTIC 3X8 NADH LF (GAUZE/BANDAGES/DRESSINGS) ×3 IMPLANT
DRSG PAD ABDOMINAL 8X10 ST (GAUZE/BANDAGES/DRESSINGS) ×12 IMPLANT
ELECT REM PT RETURN 9FT ADLT (ELECTROSURGICAL) ×3
ELECTRODE REM PT RTRN 9FT ADLT (ELECTROSURGICAL) ×1 IMPLANT
EVACUATOR 1/8 PVC DRAIN (DRAIN) IMPLANT
EVACUATOR 3/16  PVC DRAIN (DRAIN)
EVACUATOR 3/16 PVC DRAIN (DRAIN) IMPLANT
GAUZE SPONGE 4X4 12PLY STRL (GAUZE/BANDAGES/DRESSINGS) ×3 IMPLANT
GLOVE BIO SURGEON STRL SZ7.5 (GLOVE) ×3 IMPLANT
GLOVE BIO SURGEON STRL SZ8 (GLOVE) ×3 IMPLANT
GLOVE BIOGEL PI IND STRL 7.5 (GLOVE) ×1 IMPLANT
GLOVE BIOGEL PI IND STRL 8 (GLOVE) ×1 IMPLANT
GLOVE BIOGEL PI INDICATOR 7.5 (GLOVE) ×2
GLOVE BIOGEL PI INDICATOR 8 (GLOVE) ×2
GLOVE SURG SS PI 6.5 STRL IVOR (GLOVE) ×3 IMPLANT
GLOVE XGUARD RR 2 7.5 (GLOVE) ×1 IMPLANT
GLOVE XGUARD RR2 7.5 (GLOVE) ×2
GOWN STRL REUS W/ TWL LRG LVL3 (GOWN DISPOSABLE) ×2 IMPLANT
GOWN STRL REUS W/ TWL XL LVL3 (GOWN DISPOSABLE) ×1 IMPLANT
GOWN STRL REUS W/TWL LRG LVL3 (GOWN DISPOSABLE) ×4
GOWN STRL REUS W/TWL XL LVL3 (GOWN DISPOSABLE) ×2
HANDPIECE INTERPULSE COAX TIP (DISPOSABLE) ×2
IMMOBILIZER KNEE 22 UNIV (SOFTGOODS) IMPLANT
KIT BASIN OR (CUSTOM PROCEDURE TRAY) ×3 IMPLANT
KIT ROOM TURNOVER OR (KITS) ×3 IMPLANT
NDL SUT 6 .5 CRC .975X.05 MAYO (NEEDLE) IMPLANT
NEEDLE 22X1 1/2 (OR ONLY) (NEEDLE) IMPLANT
NEEDLE MAYO TAPER (NEEDLE)
NS IRRIG 1000ML POUR BTL (IV SOLUTION) ×3 IMPLANT
PACK ORTHO EXTREMITY (CUSTOM PROCEDURE TRAY) ×3 IMPLANT
PAD ARMBOARD 7.5X6 YLW CONV (MISCELLANEOUS) ×6 IMPLANT
PAD CAST 4YDX4 CTTN HI CHSV (CAST SUPPLIES) ×1 IMPLANT
PADDING CAST COTTON 4X4 STRL (CAST SUPPLIES) ×2
PADDING CAST COTTON 6X4 STRL (CAST SUPPLIES) ×3 IMPLANT
PENCIL BUTTON HOLSTER BLD 10FT (ELECTRODE) ×3 IMPLANT
PLATE LOCK 7H STD RT PROX TIB (Plate) ×3 IMPLANT
SCREW CORTICAL 3.5MM  34MM (Screw) ×2 IMPLANT
SCREW CORTICAL 3.5MM  44MM (Screw) ×2 IMPLANT
SCREW CORTICAL 3.5MM 34MM (Screw) ×1 IMPLANT
SCREW CORTICAL 3.5MM 36MM (Screw) ×3 IMPLANT
SCREW CORTICAL 3.5MM 40MM (Screw) ×3 IMPLANT
SCREW CORTICAL 3.5MM 44MM (Screw) ×1 IMPLANT
SCREW CORTICAL 3.5MM 50MM (Screw) ×3 IMPLANT
SCREW LOCK CORT STAR 3.5X80 (Screw) ×12 IMPLANT
SCREW LOCK CORT STAR 3.5X85 (Screw) ×3 IMPLANT
SCREW LOW PROF CORTICAL 3.5X80 (Screw) ×3 IMPLANT
SCREW LP 3.5X85MM (Screw) ×3 IMPLANT
SET HNDPC FAN SPRY TIP SCT (DISPOSABLE) ×1 IMPLANT
SPONGE GAUZE 4X4 12PLY STER LF (GAUZE/BANDAGES/DRESSINGS) ×3 IMPLANT
SPONGE LAP 18X18 X RAY DECT (DISPOSABLE) ×6 IMPLANT
SPONGE SURGIFOAM ABS GEL 12-7 (HEMOSTASIS) ×3 IMPLANT
SPONGE SURGIFOAM ABS GEL SZ50 (HEMOSTASIS) ×3 IMPLANT
STAPLER VISISTAT 35W (STAPLE) ×3 IMPLANT
STOCKINETTE IMPERVIOUS LG (DRAPES) ×3 IMPLANT
SUCTION FRAZIER HANDLE 10FR (MISCELLANEOUS) ×2
SUCTION TUBE FRAZIER 10FR DISP (MISCELLANEOUS) ×1 IMPLANT
SUT ETHILON 2 0 FS 18 (SUTURE) ×3 IMPLANT
SUT ETHILON 3 0 PS 1 (SUTURE) ×3 IMPLANT
SUT FIBERWIRE #2 38 T-5 BLUE (SUTURE) ×3
SUT MON AB 3-0 SH 27 (SUTURE) ×2
SUT MON AB 3-0 SH27 (SUTURE) ×1 IMPLANT
SUT PROLENE 0 CT 2 (SUTURE) ×6 IMPLANT
SUT VIC AB 0 CT1 27 (SUTURE) ×2
SUT VIC AB 0 CT1 27XBRD ANBCTR (SUTURE) ×1 IMPLANT
SUT VIC AB 1 CT1 27 (SUTURE) ×4
SUT VIC AB 1 CT1 27XBRD ANBCTR (SUTURE) ×2 IMPLANT
SUT VIC AB 2-0 CT1 27 (SUTURE) ×6
SUT VIC AB 2-0 CT1 TAPERPNT 27 (SUTURE) ×3 IMPLANT
SUTURE FIBERWR #2 38 T-5 BLUE (SUTURE) ×1 IMPLANT
SYR 20ML ECCENTRIC (SYRINGE) IMPLANT
SYR CONTROL 10ML LL (SYRINGE) ×3 IMPLANT
TOWEL OR 17X24 6PK STRL BLUE (TOWEL DISPOSABLE) ×3 IMPLANT
TOWEL OR 17X26 10 PK STRL BLUE (TOWEL DISPOSABLE) ×6 IMPLANT
TRAY FOLEY CATH 16FRSI W/METER (SET/KITS/TRAYS/PACK) IMPLANT
TUBE CONNECTING 12'X1/4 (SUCTIONS) ×2
TUBE CONNECTING 12X1/4 (SUCTIONS) ×4 IMPLANT
TUBE CONNECTING 20'X1/4 (TUBING) ×1
TUBE CONNECTING 20X1/4 (TUBING) ×2 IMPLANT
WATER STERILE IRR 1000ML POUR (IV SOLUTION) ×6 IMPLANT
WIRE K 1.6MM 144256 (MISCELLANEOUS) ×12 IMPLANT
YANKAUER SUCT BULB TIP NO VENT (SUCTIONS) ×9 IMPLANT

## 2015-09-03 NOTE — Transfer of Care (Signed)
Immediate Anesthesia Transfer of Care Note  Patient: Cordella RegisterJoshua Hoes  Procedure(s) Performed: Procedure(s): OPEN REDUCTION INTERNAL FIXATION (ORIF) TIBIAL PLATEAU, POSSIBLE REAMED INTRAMEDULLARY ASPIRATE OR ILIAC CREST GRAFTING (Right)  Patient Location: PACU  Anesthesia Type:General  Level of Consciousness: sedated  Airway & Oxygen Therapy: Patient Spontanous Breathing and Patient connected to nasal cannula oxygen  Post-op Assessment: Report given to RN and Post -op Vital signs reviewed and stable  Post vital signs: stable  Last Vitals:  Filed Vitals:   09/03/15 0543 09/03/15 1132  BP: 128/77 126/77  Pulse: 72 66  Temp: 37.1 C 37.1 C  Resp: 16 16    Complications: No apparent anesthesia complications

## 2015-09-03 NOTE — Brief Op Note (Addendum)
08/30/2015 - 09/03/2015  5:11 PM  PATIENT:  Eric [name]  35 y.o. male  PRE-OPERATIVE DIAGNOSIS:   1. RIGHT BICONDYLAR TIBIAL PLATEAU FRACTURE 2. TIBIAL EMINENCE FRACTURE 3. SUSPECTED TORN LATERAL MENISCUS  POST-OPERATIVE DIAGNOSIS:   1. RIGHT BICONDYLAR TIBIAL PLATEAU FRACTURE 2. TIBIAL EMINENCE FRACTURE 3. INTACT LATERAL MENISCUS  PROCEDURE:  Procedure(s): 1. OPEN REDUCTION INTERNAL FIXATION (ORIF) BICONDYLAR TIBIAL PLATEAU 2. OPEN REDUCTION INTERNAL FIXATION OF TIBIAL EMINENCE 3. ILIAC CREST BONE GRAFTING (Right) 4. ANTERIOR COMPARTMENT FASCIOTOMY  SURGEON:  Surgeon(s) and Role:    * Myrene GalasMichael Darwyn Ponzo, MD - Primary  PHYSICIAN ASSISTANT: Montez MoritaKeith Paul, PA-C  ANESTHESIA:   general  I/O:  Total I/O In: 7457.5 [I.V.:7457.5] Out: 100 [Blood:100]  SPECIMEN:  No Specimen  TOURNIQUET:    DICTATION: [id number]

## 2015-09-03 NOTE — Progress Notes (Signed)
PT Cancellation Note  Patient Details Name: Eric [name] MRN: [id number] DOB: 12/05/1980   Cancelled Treatment:    Reason Eval/Treat Not Completed: Patient at procedure or test/unavailable.  PT was not able to get by to see pt before surgery today.  We will check on him tomorrow for re-assessment.  Please re-order post op and updated any WB and/or ROM changes.  Thanks,    Rollene Rotundaebecca B. Woodard Perrell, PT, DPT [phone]   09/03/2015, 12:32 PM

## 2015-09-03 NOTE — Anesthesia Procedure Notes (Signed)
Procedure Name: Intubation Date/Time: 09/03/2015 1:49 PM Performed by: Fransisca KaufmannMEYER, Trysta Showman E Pre-anesthesia Checklist: Patient identified, Emergency Drugs available, Suction available, Patient being monitored and Timeout performed Patient Re-evaluated:Patient Re-evaluated prior to inductionOxygen Delivery Method: Circle system utilized Preoxygenation: Pre-oxygenation with 100% oxygen Intubation Type: IV induction Ventilation: Two handed mask ventilation required Laryngoscope Size: Miller and 2 Grade View: Grade I Tube type: Oral Tube size: 7.5 mm Number of attempts: 1 Airway Equipment and Method: Stylet Placement Confirmation: ETT inserted through vocal cords under direct vision,  positive ETCO2 and breath sounds checked- equal and bilateral Secured at: 23 cm Tube secured with: Tape Dental Injury: Teeth and Oropharynx as per pre-operative assessment

## 2015-09-03 NOTE — Anesthesia Preprocedure Evaluation (Addendum)
Anesthesia Evaluation  Patient identified by MRN, date of birth, ID band Patient awake    Reviewed: Allergy & Precautions, NPO status , Patient's Chart, lab work & pertinent test results  Airway Mallampati: II  TM Distance: >3 FB     Dental  (+) Teeth Intact, Dental Advisory Given   Pulmonary    breath sounds clear to auscultation       Cardiovascular  Rhythm:Regular Rate:Normal     Neuro/Psych    GI/Hepatic   Endo/Other    Renal/GU      Musculoskeletal   Abdominal   Peds  Hematology   Anesthesia Other Findings   Reproductive/Obstetrics                             Anesthesia Physical Anesthesia Plan  ASA: II  Anesthesia Plan: General   Post-op Pain Management:    Induction: Intravenous  Airway Management Planned: Oral ETT  Additional Equipment:   Intra-op Plan:   Post-operative Plan: Extubation in OR  Informed Consent: I have reviewed the patients History and Physical, chart, labs and discussed the procedure including the risks, benefits and alternatives for the proposed anesthesia with the patient or authorized representative who has indicated his/her understanding and acceptance.   Dental advisory given  Plan Discussed with: CRNA and Anesthesiologist  Anesthesia Plan Comments:         Anesthesia Quick Evaluation  

## 2015-09-04 ENCOUNTER — Encounter (HOSPITAL_COMMUNITY): Payer: Self-pay | Admitting: Orthopedic Surgery

## 2015-09-04 LAB — BASIC METABOLIC PANEL
ANION GAP: 10 (ref 5–15)
BUN: 11 mg/dL (ref 6–20)
CALCIUM: 8.6 mg/dL — AB (ref 8.9–10.3)
CO2: 24 mmol/L (ref 22–32)
Chloride: 100 mmol/L — ABNORMAL LOW (ref 101–111)
Creatinine, Ser: 0.92 mg/dL (ref 0.61–1.24)
GLUCOSE: 134 mg/dL — AB (ref 65–99)
POTASSIUM: 4 mmol/L (ref 3.5–5.1)
Sodium: 134 mmol/L — ABNORMAL LOW (ref 135–145)

## 2015-09-04 MED ORDER — VITAMIN D 1000 UNITS PO TABS
2000.0000 [IU] | ORAL_TABLET | Freq: Two times a day (BID) | ORAL | Status: DC
  Administered 2015-09-04 – 2015-09-06 (×5): 2000 [IU] via ORAL
  Filled 2015-09-04 (×5): qty 2

## 2015-09-04 MED ORDER — CALCIUM CITRATE 950 (200 CA) MG PO TABS
200.0000 mg | ORAL_TABLET | Freq: Every day | ORAL | Status: DC
  Administered 2015-09-04 – 2015-09-06 (×3): 200 mg via ORAL
  Filled 2015-09-04 (×4): qty 1

## 2015-09-04 NOTE — Progress Notes (Signed)
Orthopedic Tech Progress Note Patient Details:  Eric [name] 07/23/1980 [id number] Brace order completed by bio-tech vendor. Patient ID: Eric [name], male   DOB: 12/11/1980, 35 y.o.   MRN: [id number]   [name], [name] 09/04/2015, 4:17 PM
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
## 2015-09-04 NOTE — Progress Notes (Signed)
Orthopedic Tech Progress Note Patient Details:  Eric [name] 06/01/1980 [id number] Called [employer] for brace order. Patient ID: Eric [name], male   DOB: 10/03/1980, 35 y.o.   MRN: [id number]   [name], [name] 09/04/2015, 3:11 PM
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
## 2015-09-04 NOTE — Progress Notes (Signed)
Patient states in a lot of pain, refused to get daily meds. Due right now stated to wait 30 minutes and then he would take his daily medications due.

## 2015-09-04 NOTE — Progress Notes (Signed)
PT Cancellation Note  Patient Details Name: Eric [name] MRN: [id number] DOB: 12/14/1980   Cancelled Treatment:    Reason Eval/Treat Not Completed: Other (comment).  Pt working with OT.  PT to check back later today.   Thanks,    Rollene Rotundaebecca B. Natisha Trzcinski, PT, DPT [phone]   09/04/2015, 10:35 AM
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
## 2015-09-05 LAB — CBC
HCT: 34.3 % — ABNORMAL LOW (ref 39.0–52.0)
HEMOGLOBIN: 11.7 g/dL — AB (ref 13.0–17.0)
MCH: 30.1 pg (ref 26.0–34.0)
MCHC: 34.1 g/dL (ref 30.0–36.0)
MCV: 88.2 fL (ref 78.0–100.0)
Platelets: 185 10*3/uL (ref 150–400)
RBC: 3.89 MIL/uL — ABNORMAL LOW (ref 4.22–5.81)
RDW: 11.9 % (ref 11.5–15.5)
WBC: 8.9 10*3/uL (ref 4.0–10.5)

## 2015-09-05 LAB — BASIC METABOLIC PANEL
ANION GAP: 9 (ref 5–15)
BUN: 11 mg/dL (ref 6–20)
CALCIUM: 8.5 mg/dL — AB (ref 8.9–10.3)
CHLORIDE: 101 mmol/L (ref 101–111)
CO2: 26 mmol/L (ref 22–32)
Creatinine, Ser: 0.88 mg/dL (ref 0.61–1.24)
GFR calc Af Amer: >60 mL/min (ref >60)
GFR calc non Af Amer: >60 mL/min (ref >60)
Glucose, Bld: 138 mg/dL — ABNORMAL HIGH (ref 65–99)
POTASSIUM: 4.1 mmol/L (ref 3.5–5.1)
SODIUM: 136 mmol/L (ref 135–145)
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
## 2015-09-05 NOTE — Progress Notes (Signed)
PT Cancellation Note  Patient Details Name: Eric [name] MRN: [id number] DOB: 09/27/1980   Cancelled Treatment:    Reason Eval/Treat Not Completed: Other (comment).  Pt awaiting pain meds.  Wants PT to check back after they have had time to kick in. PT coming back later for second session to try to help pt progress for hopeful d/c in AM.    Thanks,    Lurena Joinerebecca B. Bronsyn Shappell, PT, DPT [phone]   09/05/2015, 2:54 PM

## 2015-09-06 ENCOUNTER — Encounter (HOSPITAL_COMMUNITY): Payer: Self-pay | Admitting: Orthopedic Surgery

## 2015-09-06 DIAGNOSIS — F419 Anxiety disorder, unspecified: Secondary | ICD-10-CM | POA: Diagnosis present

## 2015-09-06 DIAGNOSIS — K219 Gastro-esophageal reflux disease without esophagitis: Secondary | ICD-10-CM | POA: Diagnosis present

## 2015-09-06 LAB — BASIC METABOLIC PANEL
Anion gap: 10 (ref 5–15)
BUN: 10 mg/dL (ref 6–20)
CALCIUM: 8.7 mg/dL — AB (ref 8.9–10.3)
CO2: 28 mmol/L (ref 22–32)
Chloride: 98 mmol/L — ABNORMAL LOW (ref 101–111)
Creatinine, Ser: 0.86 mg/dL (ref 0.61–1.24)
GFR calc Af Amer: >60 mL/min (ref >60)
GLUCOSE: 119 mg/dL — AB (ref 65–99)
Potassium: 3.9 mmol/L (ref 3.5–5.1)
Sodium: 136 mmol/L (ref 135–145)

## 2015-09-06 MED ORDER — OXYCODONE-ACETAMINOPHEN 5-325 MG PO TABS: 1.0000 | ORAL_TABLET | Freq: Four times a day (QID) | ORAL | Status: DC | PRN

## 2015-09-06 MED ORDER — METHOCARBAMOL 500 MG PO TABS: 500.0000 mg | ORAL_TABLET | Freq: Four times a day (QID) | ORAL | Status: DC | PRN

## 2015-09-06 MED ORDER — CALCIUM CITRATE 950 (200 CA) MG PO TABS: 200.0000 mg | ORAL_TABLET | Freq: Every day | ORAL | Status: DC

## 2015-09-06 MED ORDER — ENOXAPARIN SODIUM 40 MG/0.4ML ~~LOC~~ SOLN: 40.0000 mg | SUBCUTANEOUS | Status: DC

## 2015-09-06 MED ORDER — OXYCODONE HCL 5 MG PO TABS: 5.0000 mg | ORAL_TABLET | Freq: Four times a day (QID) | ORAL | Status: DC | PRN

## 2015-09-06 MED ORDER — CHOLECALCIFEROL 50 MCG (2000 UT) PO TABS: 2000.0000 [IU] | ORAL_TABLET | Freq: Two times a day (BID) | ORAL | Status: DC

## 2015-09-06 MED ORDER — ENOXAPARIN (LOVENOX) PATIENT EDUCATION KIT
PACK | Freq: Once | Status: DC
  Filled 2015-09-06: qty 1

## 2015-09-06 MED ORDER — ASCORBIC ACID 500 MG PO TABS: 500.0000 mg | ORAL_TABLET | Freq: Every day | ORAL | Status: DC

## 2015-09-06 MED ORDER — DOCUSATE SODIUM 100 MG PO CAPS: 100.0000 mg | ORAL_CAPSULE | Freq: Two times a day (BID) | ORAL | Status: DC
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
## 2015-09-06 NOTE — Discharge Instructions (Signed)
Orthopaedic Trauma Service Discharge Instructions   General Discharge Instructions  WEIGHT BEARING STATUS: Nonweightbearing Right Leg   RANGE OF MOTION/ACTIVITY: R knee range of motion as tolerated. Activity as tolerated while maintaining weightbearing restrictions   Wound Care: daily wound care. See below   Discharge Wound Care Instructions  Do NOT apply any ointments, solutions or lotions to pin sites or surgical wounds.  These prevent needed drainage and even though solutions like hydrogen peroxide kill bacteria, they also damage cells lining the pin sites that help fight infection.  Applying lotions or ointments can keep the wounds moist and can cause them to breakdown and open up as well. This can increase the risk for infection. When in doubt call the office.  Surgical incisions should be dressed daily.  If any drainage is noted, use one layer of adaptic, then gauze, Kerlix, and an ace wrap.  Once the incision is completely dry and without drainage, it may be left open to air out.  Showering may begin 36-48 hours later.  Cleaning gently with soap and water.  Traumatic wounds should be dressed daily as well.    One layer of adaptic, gauze, Kerlix, then ace wrap.  The adaptic can be discontinued once the draining has ceased    If you have a wet to dry dressing: wet the gauze with saline the squeeze as much saline out so the gauze is moist (not soaking wet), place moistened gauze over wound, then place a dry gauze over the moist one, followed by Kerlix wrap, then ace wrap.    PAIN MEDICATION USE AND EXPECTATIONS  You have likely been given narcotic medications to help control your pain.  After a traumatic event that results in an fracture (broken bone) with or without surgery, it is ok to use narcotic pain medications to help control one's pain.  We understand that everyone responds to pain differently and each individual patient will be evaluated on a regular basis for the continued  need for narcotic medications. Ideally, narcotic medication use should last no more than 6-8 weeks (coinciding with fracture healing).   As a patient it is your responsibility as well to monitor narcotic medication use and report the amount and frequency you use these medications when you come to your office visit.   We would also advise that if you are using narcotic medications, you should take a dose prior to therapy to maximize you participation.  IF YOU ARE ON NARCOTIC MEDICATIONS IT IS NOT PERMISSIBLE TO OPERATE A MOTOR VEHICLE (MOTORCYCLE/CAR/TRUCK/MOPED) OR HEAVY MACHINERY DO NOT MIX NARCOTICS WITH OTHER CNS (CENTRAL NERVOUS SYSTEM) DEPRESSANTS SUCH AS ALCOHOL  Diet: as you were eating previously.  Can use over the counter stool softeners and bowel preparations, such as Miralax, to help with bowel movements.  Narcotics can be constipating.  Be sure to drink plenty of fluids    STOP SMOKING OR USING NICOTINE PRODUCTS!!!!  As discussed nicotine severely impairs your body's ability to heal surgical and traumatic wounds but also impairs bone healing.  Wounds and bone heal by forming microscopic blood vessels (angiogenesis) and nicotine is a vasoconstrictor (essentially, shrinks blood vessels).  Therefore, if vasoconstriction occurs to these microscopic blood vessels they essentially disappear and are unable to deliver necessary nutrients to the healing tissue.  This is one modifiable factor that you can do to dramatically increase your chances of healing your injury.    (This means no smoking, no nicotine gum, patches, etc)  DO NOT USE NONSTEROIDAL ANTI-INFLAMMATORY  DRUGS (NSAID'S)  Using products such as Advil (ibuprofen), Aleve (naproxen), Motrin (ibuprofen) for additional pain control during fracture healing can delay and/or prevent the healing response.  If you would like to take over the counter (OTC) medication, Tylenol (acetaminophen) is ok.  However, some narcotic medications that are  given for pain control contain acetaminophen as well. Therefore, you should not exceed more than 4000 mg of tylenol in a day if you do not have liver disease.  Also note that there are may OTC medicines, such as cold medicines and allergy medicines that my contain tylenol as well.  If you have any questions about medications and/or interactions please ask your doctor/PA or your pharmacist.      ICE AND ELEVATE INJURED/OPERATIVE EXTREMITY  Using ice and elevating the injured extremity above your heart can help with swelling and pain control.  Icing in a pulsatile fashion, such as 20 minutes on and 20 minutes off, can be followed.    Do not place ice directly on skin. Make sure there is a barrier between to skin and the ice pack.    Using frozen items such as frozen peas works well as the conform nicely to the are that needs to be iced.  USE AN ACE WRAP OR TED HOSE FOR SWELLING CONTROL  In addition to icing and elevation, Ace wraps or TED hose are used to help limit and resolve swelling.  It is recommended to use Ace wraps or TED hose until you are informed to stop.    When using Ace Wraps start the wrapping distally (farthest away from the body) and wrap proximally (closer to the body)   Example: If you had surgery on your leg or thing and you do not have a splint on, start the ace wrap at the toes and work your way up to the thigh        If you had surgery on your upper extremity and do not have a splint on, start the ace wrap at your fingers and work your way up to the upper arm  IF YOU ARE IN A SPLINT OR CAST DO NOT REMOVE IT FOR ANY REASON   If your splint gets wet for any reason please contact the office immediately. You may shower in your splint or cast as long as you keep it dry.  This can be done by wrapping in a cast cover or garbage back (or similar)  Do Not stick any thing down your splint or cast such as pencils, money, or hangers to try and scratch yourself with.  If you feel itchy take  benadryl as prescribed on the bottle for itching  IF YOU ARE IN A CAM BOOT (BLACK BOOT)  You may remove boot periodically. Perform daily dressing changes as noted below.  Wash the liner of the boot regularly and wear a sock when wearing the boot. It is recommended that you sleep in the boot until told otherwise  CALL THE OFFICE WITH ANY QUESTIONS OR CONCERTS: [phone]

## 2015-09-06 NOTE — Progress Notes (Signed)
Orthopedic Tech Progress Note Patient Details:  Eric [name] 03/25/1981 [id number]  Ortho Devices Type of Ortho Device: Ace wrap, Knee Immobilizer, Quincy Simmondsobert Jones splint Ortho Device/Splint Location: foot roll Ortho Device/Splint Interventions: Application   Saul FordyceJennifer C Daxtin Leiker 09/06/2015, 10:58 AM
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
## 2015-09-07 NOTE — Anesthesia Postprocedure Evaluation (Signed)
Anesthesia Post Note  Patient: Eric [name]  Procedure(s) Performed: Procedure(s) (LRB): OPEN REDUCTION INTERNAL FIXATION (ORIF) TIBIAL PLATEAU, POSSIBLE REAMED INTRAMEDULLARY ASPIRATE OR ILIAC CREST GRAFTING (Right)  Patient location during evaluation: PACU Anesthesia Type: General Level of consciousness: awake and alert Pain management: pain level controlled Vital Signs Assessment: post-procedure vital signs reviewed and stable Respiratory status: spontaneous breathing, nonlabored ventilation, respiratory function stable and patient connected to nasal cannula oxygen Cardiovascular status: blood pressure returned to baseline and stable Postop Assessment: no signs of nausea or vomiting Anesthetic complications: no    Last Vitals:  Filed Vitals:   09/06/15 0300 09/06/15 1300  BP: 120/79 116/65  Pulse: 74 95  Temp: 36.8 C 37.2 C  Resp:  18    Last Pain:  Filed Vitals:   09/06/15 1320  PainSc: 3                  Kennieth RadFitzgerald, Orvetta Danielski E
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
## 2018-11-08 ENCOUNTER — Ambulatory Visit (INDEPENDENT_AMBULATORY_CARE_PROVIDER_SITE_OTHER): Payer: Self-pay | Admitting: Podiatry

## 2018-11-08 ENCOUNTER — Ambulatory Visit (INDEPENDENT_AMBULATORY_CARE_PROVIDER_SITE_OTHER): Payer: Self-pay

## 2018-11-08 ENCOUNTER — Other Ambulatory Visit: Payer: Self-pay

## 2018-11-08 ENCOUNTER — Encounter: Payer: Self-pay | Admitting: Podiatry

## 2018-11-08 VITALS — BP 120/77 | HR 67 | Temp 97.9°F | Ht 74.0 in | Wt 315.0 lb

## 2018-11-08 DIAGNOSIS — M722 Plantar fascial fibromatosis: Secondary | ICD-10-CM

## 2018-11-08 NOTE — Progress Notes (Signed)
Subjective:  Patient ID: Eric [name], male    DOB: 1980-06-30,  MRN: [id number]  Chief Complaint  Patient presents with  . Foot Pain    left heel pain for 4-6 wks     37 y.o. male presents with the above complaint. Hx as above.    Review of Systems: Negative except as noted in the HPI. Denies N/V/F/Ch.  Past Medical History:  Diagnosis Date  . Anxiety   . GERD (gastroesophageal reflux disease)   . Tibial plateau fracture, right 08/30/2015    Current Outpatient Medications:  .  FLUoxetine (PROZAC) 20 MG capsule, Take 20 mg by mouth daily., Disp: , Rfl:  .  lansoprazole (PREVACID) 30 MG capsule, Take 30 mg by mouth daily at 12 noon., Disp: , Rfl:  .  calcium citrate (CALCITRATE - DOSED IN MG ELEMENTAL CALCIUM) 950 MG tablet, Take 1 tablet (200 mg of elemental calcium total) by mouth daily with breakfast. (Patient not taking: Reported on 11/08/2018), Disp: 30 tablet, Rfl: 2 .  cholecalciferol 2000 units TABS, Take 1 tablet (2,000 Units total) by mouth 2 (two) times daily. (Patient not taking: Reported on 11/08/2018), Disp: 60 tablet, Rfl: 5 .  docusate sodium (COLACE) 100 MG capsule, Take 1 capsule (100 mg total) by mouth 2 (two) times daily. (Patient not taking: Reported on 11/08/2018), Disp: 30 capsule, Rfl: 0 .  enoxaparin (LOVENOX) 40 MG/0.4ML injection, Inject 0.4 mLs (40 mg total) into the skin daily. (Patient not taking: Reported on 11/08/2018), Disp: 14 Syringe, Rfl: 0 .  methocarbamol (ROBAXIN) 500 MG tablet, Take 1-2 tablets (500-1,000 mg total) by mouth every 6 (six) hours as needed for muscle spasms. (Patient not taking: Reported on 11/08/2018), Disp: 90 tablet, Rfl: 0 .  oxyCODONE (OXY IR/ROXICODONE) 5 MG immediate release tablet, Take 1-2 tablets (5-10 mg total) by mouth every 6 (six) hours as needed for breakthrough pain. (Patient not taking: Reported on 11/08/2018), Disp: 30 tablet, Rfl: 0 .  oxyCODONE-acetaminophen (ROXICET) 5-325 MG tablet, Take 1-2 tablets by mouth every 6  (six) hours as needed for severe pain. (Patient not taking: Reported on 11/08/2018), Disp: 90 tablet, Rfl: 0 .  vitamin C (VITAMIN C) 500 MG tablet, Take 1 tablet (500 mg total) by mouth daily. (Patient not taking: Reported on 11/08/2018), Disp: 30 tablet, Rfl: 2  Social History   Tobacco Use  Smoking Status Never Smoker  Smokeless Tobacco Former User  Tobacco Comment   3-4 cans dip/week     No Known Allergies Objective:   Vitals:   11/08/18 0851  BP: 120/77  Pulse: 67  Temp: 97.9 F (36.6 C)   Body mass index is 40.44 kg/m. Constitutional Well developed. Well nourished.  Vascular Dorsalis pedis pulses palpable bilaterally. Posterior tibial pulses palpable bilaterally. Capillary refill normal to all digits.  No cyanosis or clubbing noted. Pedal hair growth normal.  Neurologic Normal speech. Oriented to person, place, and time. Epicritic sensation to light touch grossly present bilaterally.  Dermatologic Nails well groomed and normal in appearance. No open wounds. No skin lesions.  Orthopedic: Normal joint ROM without pain or crepitus bilaterally. No visible deformities. Tender to palpation at the calcaneal tuber left. No pain with calcaneal squeeze left. Ankle ROM diminished range of motion left. Silfverskiold Test: positive left.   Radiographs: Taken and reviewed. No acute fractures or dislocations. No evidence of stress fracture.  Plantar heel spur present. Posterior heel spur present.   Assessment:   1. Plantar fasciitis, left    Plan:  Patient  was evaluated and treated and all questions answered.  Plantar Fasciitis, left - XR reviewed as above.  - Educated on icing and stretching. Instructions given.  - Injection delivered to the plantar fascia as below.  Procedure: Injection Tendon/Ligament Location: Left plantar fascia at the glabrous junction; medial approach. Skin Prep: alcohol Injectate: 1 cc 0.5% marcaine plain, 1 cc dexamethasone phosphate, 0.5 cc  kenalog 10. Disposition: Patient tolerated procedure well. Injection site dressed with a band-aid.  Return in about 3 weeks (around 11/29/2018) for Plantar fasciitis, Left.

## 2018-11-08 NOTE — Patient Instructions (Signed)

## 2018-11-29 ENCOUNTER — Other Ambulatory Visit: Payer: Self-pay

## 2018-11-29 ENCOUNTER — Encounter: Payer: Self-pay | Admitting: Podiatry

## 2019-07-11 ENCOUNTER — Other Ambulatory Visit: Payer: Self-pay | Admitting: Legal Medicine

## 2019-07-11 MED ORDER — FLUOXETINE HCL 20 MG PO CAPS: 20.0000 mg | ORAL_CAPSULE | Freq: Every day | ORAL | 6 refills | Status: DC

## 2019-08-16 ENCOUNTER — Other Ambulatory Visit: Payer: Self-pay | Admitting: Legal Medicine

## 2019-11-10 ENCOUNTER — Other Ambulatory Visit: Payer: Self-pay | Admitting: Legal Medicine

## 2019-12-01 ENCOUNTER — Other Ambulatory Visit: Payer: Self-pay | Admitting: Legal Medicine

## 2020-01-16 ENCOUNTER — Ambulatory Visit (INDEPENDENT_AMBULATORY_CARE_PROVIDER_SITE_OTHER): Payer: Self-pay | Admitting: Legal Medicine

## 2020-01-16 ENCOUNTER — Other Ambulatory Visit: Payer: Self-pay

## 2020-01-16 ENCOUNTER — Encounter: Payer: Self-pay | Admitting: Legal Medicine

## 2020-01-16 VITALS — BP 140/90 | HR 71 | Temp 97.1°F | Resp 16 | Ht 74.0 in | Wt 341.8 lb

## 2020-01-16 DIAGNOSIS — K219 Gastro-esophageal reflux disease without esophagitis: Secondary | ICD-10-CM

## 2020-01-16 DIAGNOSIS — Z13220 Encounter for screening for lipoid disorders: Secondary | ICD-10-CM

## 2020-01-16 DIAGNOSIS — F419 Anxiety disorder, unspecified: Secondary | ICD-10-CM

## 2020-01-16 DIAGNOSIS — Z6841 Body Mass Index (BMI) 40.0 and over, adult: Secondary | ICD-10-CM

## 2020-01-16 NOTE — Progress Notes (Signed)
Subjective:  Patient ID: Eric [name], male    DOB: 02-22-1981  Age: 39 y.o. MRN: [id number]  Chief Complaint  Patient presents with  . Anxiety  . Gastroesophageal Reflux    HPI: Chronic visit  This patient has major depression for 6 months.  PHQ9 =0.  Patient is having less anhedonia.  The patient has improved future plans and prospects.  The depression is worse with stress.  The patient is not exercising and working on behavior to improve mental health.  Patient is not  seeing a therapist or psychiatrist.  na  Patient is on none  Patient has gastroesophageal reflux symptoms withesophagitis and LTRD.  The symptoms are mild intensity.  Length of symptoms 10 years.  Medicines include prevacid.  Complications include none..   Current Outpatient Medications on File Prior to Visit  Medication Sig Dispense Refill  . FLUoxetine (PROZAC) 20 MG capsule TAKE 1 CAPSULE BY MOUTH DAILY 30 capsule 6  . lansoprazole (PREVACID) 15 MG capsule TAKE 1 CAPSULE BY MOUTH DAILY 30 capsule 2  . LORazepam (ATIVAN) 0.5 MG tablet TAKE 1 TABLET BY MOUTH DAILY AS NEEDED FOR ANXIETY 30 tablet 3   No current facility-administered medications on file prior to visit.   Past Medical History:  Diagnosis Date  . Anxiety   . GERD (gastroesophageal reflux disease)   . Tibial plateau fracture, right 08/30/2015   Past Surgical History:  Procedure Laterality Date  . ORIF TIBIA PLATEAU Right 09/03/2015   Procedure: OPEN REDUCTION INTERNAL FIXATION (ORIF) TIBIAL PLATEAU, POSSIBLE REAMED INTRAMEDULLARY ASPIRATE OR ILIAC CREST GRAFTING;  Surgeon: Myrene Galas, MD;  Location: [facility] OR;  Service: Orthopedics;  Laterality: Right;  . TONSILLECTOMY AND ADENOIDECTOMY      History reviewed. No pertinent family history. Social History   Socioeconomic History  . Marital status: Married    Spouse name: Not on file  . Number of children: Not on file  . Years of education: Not on file  . Highest education level: Not on file    Occupational History  . Occupation: farmer   Tobacco Use  . Smoking status: Never Smoker  . Smokeless tobacco: Former Neurosurgeon  . Tobacco comment: 3-4 cans dip/week   Substance and Sexual Activity  . Alcohol use: No    Alcohol/week: 0.0 standard drinks  . Drug use: No  . Sexual activity: Not on file  Other Topics Concern  . Not on file  Social History Narrative  . Not on file   Social Determinants of Health   Financial Resource Strain:   . Difficulty of Paying Living Expenses: Not on file  Food Insecurity:   . Worried About Programme researcher, broadcasting/film/video in the Last Year: Not on file  . Ran Out of Food in the Last Year: Not on file  Transportation Needs:   . Lack of Transportation (Medical): Not on file  . Lack of Transportation (Non-Medical): Not on file  Physical Activity:   . Days of Exercise per Week: Not on file  . Minutes of Exercise per Session: Not on file  Stress:   . Feeling of Stress : Not on file  Social Connections:   . Frequency of Communication with Friends and Family: Not on file  . Frequency of Social Gatherings with Friends and Family: Not on file  . Attends Religious Services: Not on file  . Active Member of Clubs or Organizations: Not on file  . Attends Banker Meetings: Not on file  . Marital Status: Not  on file    Review of Systems  Constitutional: Negative.   HENT: Negative.   Eyes: Negative.   Respiratory: Negative.   Cardiovascular: Negative.   Gastrointestinal: Negative.   Genitourinary: Negative.   Musculoskeletal: Negative.   Skin: Negative.   Neurological: Negative.   Psychiatric/Behavioral: Negative.    Depression screen [facility] 2/9 01/16/2020  Decreased Interest 0  Down, Depressed, Hopeless 0  PHQ - 2 Score 0  Altered sleeping 0  Tired, decreased energy 0  Change in appetite 0  Feeling bad or failure about yourself  0  Trouble concentrating 0  Moving slowly or fidgety/restless 0  Suicidal thoughts 0  PHQ-9 Score 0  Difficult  doing work/chores Not difficult at all     Objective:  BP 140/90   Pulse 71   Temp (!) 97.1 F (36.2 C)   Resp 16   Ht 6\' 2"  (1.88 m)   Wt (!) 341 lb 12.8 oz (155 kg)   SpO2 97%   BMI 43.88 kg/m   BP/Weight 01/16/2020 11/08/2018 09/06/2015  Systolic BP 140 120 116  Diastolic BP 90 77 65  Wt. (Lbs) 341.8 315 -  BMI 43.88 40.44 -    Physical Exam Vitals reviewed.  Constitutional:      Appearance: Normal appearance.  HENT:     Head: Normocephalic and atraumatic.     Right Ear: Tympanic membrane, ear canal and external ear normal.     Left Ear: Tympanic membrane, ear canal and external ear normal.     Mouth/Throat:     Mouth: Mucous membranes are moist.     Pharynx: Oropharynx is clear.  Eyes:     Extraocular Movements: Extraocular movements intact.     Conjunctiva/sclera: Conjunctivae normal.     Pupils: Pupils are equal, round, and reactive to light.  Cardiovascular:     Rate and Rhythm: Normal rate and regular rhythm.     Pulses: Normal pulses.     Heart sounds: Normal heart sounds.  Pulmonary:     Effort: Pulmonary effort is normal.     Breath sounds: Normal breath sounds.  Abdominal:     General: Abdomen is flat. Bowel sounds are normal.     Palpations: Abdomen is soft.  Musculoskeletal:        General: Normal range of motion.     Cervical back: Normal range of motion and neck supple.  Skin:    General: Skin is warm and dry.     Capillary Refill: Capillary refill takes less than 2 seconds.  Neurological:     General: No focal deficit present.     Mental Status: He is alert and oriented to person, place, and time.  Psychiatric:        Mood and Affect: Mood normal.        Thought Content: Thought content normal.        Judgment: Judgment normal.       Lab Results  Component Value Date   WBC 8.9 09/05/2015   HGB 11.7 (L) 09/05/2015   HCT 34.3 (L) 09/05/2015   PLT 185 09/05/2015   GLUCOSE 119 (H) 09/06/2015   ALT 43 09/03/2015   AST 34 09/03/2015    NA 136 09/06/2015   K 3.9 09/06/2015   CL 98 (L) 09/06/2015   CREATININE 0.86 09/06/2015   BUN 10 09/06/2015   CO2 28 09/06/2015   INR 1.03 09/03/2015      Assessment & Plan:   1. Gastroesophageal reflux disease, unspecified whether  esophagitis present - CBC with Differential/Platelet - Comprehensive metabolic panel Plan of care was formulated today.  She is doing well.  A plan of care was formulated using patient exam, tests and other sources to optimize care using evidence based information.  Recommend no smoking, no eating after supper, avoid fatty foods, elevate Head of bed, avoid tight fitting clothing.  Continue on prevacid.  2. Anxiety - CBC with Differential/Platelet - Comprehensive metabolic panel Patient's depression is controlled with off fluoxetine.   Anhedonia no change.  PHQ 9 was performed score 0. An individual care plan was established or reinforced today.  The patient's disease status was assessed using clinical findings on exam, labs, and or other diagnostic testing to determine patient's success in meeting treatment goals based on disease specific evidence-based guidelines and found to be improving Recommendations include stop medicines  3. Screening cholesterol level - Lipid panel He needs cholesterol level  4. Morbid obesity (HCC) An individualize plan was formulated for obesity using patient history and physical exam to encourage weight loss.  An evidence based program was formulated.  Patient is to cut portion size with meals and to plan physical exercise 3 days a week at least 20 minutes.  Weight watchers and other programs are helpful.  Planned amount of weight loss 10 lbs.  5. BMI 40.0-44.9, adult [facility]) An individualize plan was formulated for obesity using patient history and physical exam to encourage weight loss.  An evidence based program was formulated.  Patient is to cut portion size with meals and to plan physical exercise 3 days a week at least 20  minutes.  Weight watchers and other programs are helpful.  Planned amount of weight loss 10 lbs.    No orders of the defined types were placed in this encounter.   Orders Placed This Encounter  Procedures  . CBC with Differential/Platelet  . Comprehensive metabolic panel  . Lipid panel     Follow-up: Return in about 6 months (around 07/18/2020) for fasting.  An After Visit Summary was printed and given to the patient.  Brent Bulla [facility] [phone]

## 2020-01-17 LAB — LIPID PANEL
Chol/HDL Ratio: 3.8 ratio (ref 0.0–5.0)
Cholesterol, Total: 189 mg/dL (ref 100–199)
HDL: 50 mg/dL (ref >39)
LDL Chol Calc (NIH): 113 mg/dL — ABNORMAL HIGH (ref 0–99)
Triglycerides: 146 mg/dL (ref 0–149)
VLDL Cholesterol Cal: 26 mg/dL (ref 5–40)

## 2020-01-17 LAB — CBC WITH DIFFERENTIAL/PLATELET
Basophils Absolute: 0.1 10*3/uL (ref 0.0–0.2)
Basos: 1 %
EOS (ABSOLUTE): 0.2 10*3/uL (ref 0.0–0.4)
Eos: 3 %
Hematocrit: 44.4 % (ref 37.5–51.0)
Hemoglobin: 14.6 g/dL (ref 13.0–17.7)
Immature Grans (Abs): 0 10*3/uL (ref 0.0–0.1)
Immature Granulocytes: 0 %
Lymphocytes Absolute: 1.9 10*3/uL (ref 0.7–3.1)
Lymphs: 35 %
MCH: 29.8 pg (ref 26.6–33.0)
MCHC: 32.9 g/dL (ref 31.5–35.7)
MCV: 91 fL (ref 79–97)
Monocytes Absolute: 0.3 10*3/uL (ref 0.1–0.9)
Monocytes: 6 %
Neutrophils Absolute: 3.1 10*3/uL (ref 1.4–7.0)
Neutrophils: 55 %
Platelets: 225 10*3/uL (ref 150–450)
RBC: 4.9 x10E6/uL (ref 4.14–5.80)
RDW: 12.6 % (ref 11.6–15.4)
WBC: 5.6 10*3/uL (ref 3.4–10.8)

## 2020-01-17 LAB — COMPREHENSIVE METABOLIC PANEL
ALT: 66 IU/L — ABNORMAL HIGH (ref 0–44)
AST: 48 IU/L — ABNORMAL HIGH (ref 0–40)
Albumin/Globulin Ratio: 1.7 (ref 1.2–2.2)
Albumin: 4.7 g/dL (ref 4.0–5.0)
Alkaline Phosphatase: 94 IU/L (ref 48–121)
BUN/Creatinine Ratio: 15 (ref 9–20)
BUN: 16 mg/dL (ref 6–24)
Bilirubin Total: 1.2 mg/dL (ref 0.0–1.2)
CO2: 20 mmol/L (ref 20–29)
Calcium: 9.6 mg/dL (ref 8.7–10.2)
Chloride: 101 mmol/L (ref 96–106)
Creatinine, Ser: 1.08 mg/dL (ref 0.76–1.27)
GFR calc Af Amer: 99 mL/min/{1.73_m2} (ref >59)
GFR calc non Af Amer: 85 mL/min/{1.73_m2} (ref >59)
Globulin, Total: 2.8 g/dL (ref 1.5–4.5)
Glucose: 143 mg/dL — ABNORMAL HIGH (ref 65–99)
Potassium: 4.6 mmol/L (ref 3.5–5.2)
Sodium: 137 mmol/L (ref 134–144)
Total Protein: 7.5 g/dL (ref 6.0–8.5)

## 2020-01-17 LAB — CARDIOVASCULAR RISK ASSESSMENT

## 2020-01-17 NOTE — Progress Notes (Signed)
Anemia present chronic, glucose 143, kidney and liver tests slightly elevatedLDL -c 113,  lp,

## 2020-03-01 ENCOUNTER — Other Ambulatory Visit: Payer: Self-pay | Admitting: Legal Medicine

## 2020-03-01 DIAGNOSIS — K219 Gastro-esophageal reflux disease without esophagitis: Secondary | ICD-10-CM

## 2020-03-01 DIAGNOSIS — F419 Anxiety disorder, unspecified: Secondary | ICD-10-CM

## 2020-07-24 ENCOUNTER — Ambulatory Visit: Payer: Self-pay | Admitting: Legal Medicine

## 2020-11-19 ENCOUNTER — Other Ambulatory Visit: Payer: Self-pay | Admitting: Legal Medicine

## 2020-11-19 DIAGNOSIS — F419 Anxiety disorder, unspecified: Secondary | ICD-10-CM

## 2020-11-19 DIAGNOSIS — K219 Gastro-esophageal reflux disease without esophagitis: Secondary | ICD-10-CM

## 2021-01-09 ENCOUNTER — Encounter: Payer: Self-pay | Admitting: Legal Medicine

## 2021-01-09 ENCOUNTER — Other Ambulatory Visit: Payer: Self-pay

## 2021-01-09 ENCOUNTER — Ambulatory Visit: Payer: Self-pay | Admitting: Legal Medicine

## 2021-01-09 VITALS — BP 132/84 | HR 87 | Temp 97.4°F | Ht 74.0 in | Wt 329.8 lb

## 2021-01-09 DIAGNOSIS — K219 Gastro-esophageal reflux disease without esophagitis: Secondary | ICD-10-CM

## 2021-01-09 DIAGNOSIS — F419 Anxiety disorder, unspecified: Secondary | ICD-10-CM

## 2021-01-09 DIAGNOSIS — Z13220 Encounter for screening for lipoid disorders: Secondary | ICD-10-CM

## 2021-01-09 DIAGNOSIS — Z6841 Body Mass Index (BMI) 40.0 and over, adult: Secondary | ICD-10-CM

## 2021-01-09 DIAGNOSIS — A770 Spotted fever due to Rickettsia rickettsii: Secondary | ICD-10-CM

## 2021-01-09 MED ORDER — LORAZEPAM 0.5 MG PO TABS: 0.5000 mg | ORAL_TABLET | Freq: Every day | ORAL | 3 refills | Status: AC | PRN

## 2021-01-09 MED ORDER — LANSOPRAZOLE 15 MG PO CPDR: 15.0000 mg | DELAYED_RELEASE_CAPSULE | Freq: Every day | ORAL | 6 refills | Status: DC

## 2021-01-09 NOTE — Progress Notes (Signed)
Established Patient Office Visit  Subjective:  Patient ID: Eric [name], male    DOB: 02/08/1981  Age: 40 y.o. MRN: [id number]  CC:  Chief Complaint  Patient presents with   Follow-up    Follow up from [facility], Tick fever.    HPI Dejay Kronk presents for RMSF.  He was put on rocephin and doxycycline.  He has Flu-like syndrome.  He feels good now , no further fever or chills.  Past Medical History:  Diagnosis Date   Anxiety    GERD (gastroesophageal reflux disease)    Tibial plateau fracture, right 08/30/2015    Past Surgical History:  Procedure Laterality Date   ORIF TIBIA PLATEAU Right 09/03/2015   Procedure: OPEN REDUCTION INTERNAL FIXATION (ORIF) TIBIAL PLATEAU, POSSIBLE REAMED INTRAMEDULLARY ASPIRATE OR ILIAC CREST GRAFTING;  Surgeon: Myrene Galas, MD;  Location: [facility];  Service: Orthopedics;  Laterality: Right;   TONSILLECTOMY AND ADENOIDECTOMY      History reviewed. No pertinent family history.  Social History   Socioeconomic History   Marital status: Married    Spouse name: Not on file   Number of children: Not on file   Years of education: Not on file   Highest education level: Not on file  Occupational History   Occupation: farmer   Tobacco Use   Smoking status: Never   Smokeless tobacco: Former   Tobacco comments:    3-4 cans dip/week   Substance and Sexual Activity   Alcohol use: No    Alcohol/week: 0.0 standard drinks   Drug use: No   Sexual activity: Not on file  Other Topics Concern   Not on file  Social History Narrative   Not on file   Social Determinants of Health   Financial Resource Strain: Not on file  Food Insecurity: Not on file  Transportation Needs: Not on file  Physical Activity: Not on file  Stress: Not on file  Social Connections: Not on file  Intimate Partner Violence: Not on file    Outpatient Medications Prior to Visit  Medication Sig Dispense Refill   FLUoxetine (PROZAC) 20 MG capsule TAKE 1 CAPSULE BY  MOUTH DAILY 30 capsule 6   lansoprazole (PREVACID) 15 MG capsule TAKE 1 CAPSULE BY MOUTH DAILY 30 capsule 6   LORazepam (ATIVAN) 0.5 MG tablet TAKE 1 TABLET BY MOUTH DAILY AS NEEDED FOR ANXIETY 30 tablet 3   No facility-administered medications prior to visit.    No Known Allergies  ROS Review of Systems  Constitutional:  Negative for activity change and appetite change.  HENT:  Negative for congestion.   Eyes:  Negative for visual disturbance.  Respiratory:  Negative for chest tightness and shortness of breath.   Cardiovascular:  Negative for chest pain, palpitations and leg swelling.  Gastrointestinal:  Negative for abdominal distention and abdominal pain.  Endocrine: Negative for polyuria.  Genitourinary:  Negative for difficulty urinating and dysuria.  Musculoskeletal:  Negative for arthralgias and back pain.  Skin:  Positive for rash.  Neurological: Negative.   Psychiatric/Behavioral: Negative.       Objective:    Physical Exam Vitals reviewed.  Constitutional:      General: He is in acute distress.     Appearance: Normal appearance.  HENT:     Right Ear: Tympanic membrane normal.     Left Ear: Tympanic membrane normal.     Nose: Nose normal.     Mouth/Throat:     Mouth: Mucous membranes are moist.  Pharynx: Oropharynx is clear.  Eyes:     Extraocular Movements: Extraocular movements intact.     Conjunctiva/sclera: Conjunctivae normal.     Pupils: Pupils are equal, round, and reactive to light.  Cardiovascular:     Rate and Rhythm: Normal rate and regular rhythm.     Pulses: Normal pulses.     Heart sounds: Normal heart sounds. No murmur heard.   No gallop.  Pulmonary:     Effort: Pulmonary effort is normal. No respiratory distress.     Breath sounds: Normal breath sounds. No wheezing.  Abdominal:     General: Abdomen is flat. Bowel sounds are normal. There is no distension.     Palpations: Abdomen is soft.     Tenderness: There is no abdominal  tenderness.  Musculoskeletal:        General: Normal range of motion.     Cervical back: Normal range of motion.  Skin:    General: Skin is warm.     Capillary Refill: Capillary refill takes less than 2 seconds.     Findings: Rash present.     Comments: Petechiae on right leg  Neurological:     General: No focal deficit present.     Mental Status: He is alert and oriented to person, place, and time. Mental status is at baseline.  Psychiatric:        Mood and Affect: Mood normal.    BP 132/84   Pulse 87   Temp (!) 97.4 F (36.3 C)   Ht 6\' 2"  (1.88 m)   Wt (!) 329 lb 12.8 oz (149.6 kg)   SpO2 97%   BMI 42.34 kg/m  Wt Readings from Last 3 Encounters:  01/09/21 (!) 329 lb 12.8 oz (149.6 kg)  01/16/20 (!) 341 lb 12.8 oz (155 kg)  11/08/18 (!) 315 lb (142.9 kg)     Health Maintenance Due  Topic Date Due   COVID-19 Vaccine (1) Never done   HIV Screening  Never done   Hepatitis C Screening  Never done   TETANUS/TDAP  Never done   INFLUENZA VACCINE  12/24/2020    There are no preventive care reminders to display for this patient.  No results found for: TSH Lab Results  Component Value Date   WBC 5.6 01/16/2020   HGB 14.6 01/16/2020   HCT 44.4 01/16/2020   MCV 91 01/16/2020   PLT 225 01/16/2020   Lab Results  Component Value Date   NA 137 01/16/2020   K 4.6 01/16/2020   CO2 20 01/16/2020   GLUCOSE 143 (H) 01/16/2020   BUN 16 01/16/2020   CREATININE 1.08 01/16/2020   BILITOT 1.2 01/16/2020   ALKPHOS 94 01/16/2020   AST 48 (H) 01/16/2020   ALT 66 (H) 01/16/2020   PROT 7.5 01/16/2020   ALBUMIN 4.7 01/16/2020   CALCIUM 9.6 01/16/2020   ANIONGAP 10 09/06/2015   Lab Results  Component Value Date   CHOL 189 01/16/2020   Lab Results  Component Value Date   HDL 50 01/16/2020   Lab Results  Component Value Date   LDLCALC 113 (H) 01/16/2020   Lab Results  Component Value Date   TRIG 146 01/16/2020   Lab Results  Component Value Date   CHOLHDL 3.8  01/16/2020   No results found for: HGBA1C    Assessment & Plan:   Problem List Items Addressed This Visit       Digestive   GERD (gastroesophageal reflux disease) - Primary   Relevant  Medications   lansoprazole (PREVACID) 15 MG capsule   Other Relevant Orders   CBC with Differential/Platelet   Comprehensive metabolic panel Plan of care was formulated today.  She is doing well.  A plan of care was formulated using patient exam, tests and other sources to optimize care using evidence based information.  Recommend no smoking, no eating after supper, avoid fatty foods, elevate Head of bed, avoid tight fitting clothing.  Continue on prevacid.      Other   Anxiety   Relevant Medications   LORazepam (ATIVAN) 0.5 MG tablet Patient has GAD and is stable    Morbid obesity (HCC)   Relevant Orders   CBC with Differential/Platelet An individualize plan was formulated for obesity using patient history and physical exam to encourage weight loss.  An evidence based program was formulated.  Patient is to cut portion size with meals and to plan physical exercise 3 days a week at least 20 minutes.  Weight watchers and other programs are helpful.  Planned amount of weight loss 10 lbs.     BMI 40.0-44.9, adult [facility]) An individualize plan was formulated for obesity using patient history and physical exam to encourage weight loss.  An evidence based program was formulated.  Patient is to cut portion size with meals and to plan physical exercise 3 days a week at least 20 minutes.  Weight watchers and other programs are helpful.  Planned amount of weight loss 10 lbs.     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   Other Visit Diagnoses     Screening cholesterol level       Relevant Orders   Lipid panel       Meds ordered this encounter  Medications   lansoprazole (PREVACID) 15 MG capsule    Sig: Take 1 capsule (15 mg total) by mouth daily.    Dispense:   30 capsule    Refill:  6   LORazepam (ATIVAN) 0.5 MG tablet    Sig: Take 1 tablet (0.5 mg total) by mouth daily as needed. for anxiety    Dispense:  30 tablet    Refill:  3    Follow-up: No follow-ups on file.    Brent Bulla, MD

## 2021-01-10 ENCOUNTER — Other Ambulatory Visit: Payer: Self-pay

## 2021-01-10 ENCOUNTER — Other Ambulatory Visit: Payer: Self-pay | Admitting: Legal Medicine

## 2021-01-10 DIAGNOSIS — R945 Abnormal results of liver function studies: Secondary | ICD-10-CM

## 2021-01-10 DIAGNOSIS — R748 Abnormal levels of other serum enzymes: Secondary | ICD-10-CM

## 2021-01-10 DIAGNOSIS — R7989 Other specified abnormal findings of blood chemistry: Secondary | ICD-10-CM

## 2021-01-10 LAB — CBC WITH DIFFERENTIAL/PLATELET
Basophils Absolute: 0.1 10*3/uL (ref 0.0–0.2)
Basos: 1 %
EOS (ABSOLUTE): 0.1 10*3/uL (ref 0.0–0.4)
Eos: 2 %
Hematocrit: 41.2 % (ref 37.5–51.0)
Hemoglobin: 14 g/dL (ref 13.0–17.7)
Immature Grans (Abs): 0 10*3/uL (ref 0.0–0.1)
Immature Granulocytes: 0 %
Lymphocytes Absolute: 2.8 10*3/uL (ref 0.7–3.1)
Lymphs: 47 %
MCH: 29.5 pg (ref 26.6–33.0)
MCHC: 34 g/dL (ref 31.5–35.7)
MCV: 87 fL (ref 79–97)
Monocytes Absolute: 0.4 10*3/uL (ref 0.1–0.9)
Monocytes: 6 %
Neutrophils Absolute: 2.6 10*3/uL (ref 1.4–7.0)
Neutrophils: 44 %
Platelets: 257 10*3/uL (ref 150–450)
RBC: 4.75 x10E6/uL (ref 4.14–5.80)
RDW: 12.2 % (ref 11.6–15.4)
WBC: 5.9 10*3/uL (ref 3.4–10.8)

## 2021-01-10 LAB — COMPREHENSIVE METABOLIC PANEL
ALT: 110 IU/L — ABNORMAL HIGH (ref 0–44)
AST: 102 IU/L — ABNORMAL HIGH (ref 0–40)
Albumin/Globulin Ratio: 1.9 (ref 1.2–2.2)
Albumin: 4.8 g/dL (ref 4.0–5.0)
Alkaline Phosphatase: 88 IU/L (ref 44–121)
BUN/Creatinine Ratio: 11 (ref 9–20)
BUN: 11 mg/dL (ref 6–24)
Bilirubin Total: 1.2 mg/dL (ref 0.0–1.2)
CO2: 25 mmol/L (ref 20–29)
Calcium: 9.6 mg/dL (ref 8.7–10.2)
Chloride: 100 mmol/L (ref 96–106)
Creatinine, Ser: 0.97 mg/dL (ref 0.76–1.27)
Globulin, Total: 2.5 g/dL (ref 1.5–4.5)
Glucose: 103 mg/dL — ABNORMAL HIGH (ref 65–99)
Potassium: 4.5 mmol/L (ref 3.5–5.2)
Sodium: 140 mmol/L (ref 134–144)
Total Protein: 7.3 g/dL (ref 6.0–8.5)
eGFR: 101 mL/min/{1.73_m2} (ref >59)

## 2021-01-10 LAB — LIPID PANEL
Chol/HDL Ratio: 3.9 ratio (ref 0.0–5.0)
Cholesterol, Total: 148 mg/dL (ref 100–199)
HDL: 38 mg/dL — ABNORMAL LOW (ref >39)
LDL Chol Calc (NIH): 82 mg/dL (ref 0–99)
Triglycerides: 161 mg/dL — ABNORMAL HIGH (ref 0–149)
VLDL Cholesterol Cal: 28 mg/dL (ref 5–40)

## 2021-01-10 LAB — CARDIOVASCULAR RISK ASSESSMENT

## 2021-01-10 NOTE — Progress Notes (Signed)
I ordered hepatitis panel lp

## 2021-01-10 NOTE — Progress Notes (Signed)
CBC normal, glucose 103, kidney tests normal, liver tests high recommend ultrasound liver and gall bladder, triglycerides 161 hight,  lp

## 2021-01-12 LAB — ACUTE VIRAL HEPATITIS (HAV, HBV, HCV)
HCV Ab: <0.1 s/co ratio (ref 0.0–0.9)
Hep A IgM: NEGATIVE
Hep B C IgM: NEGATIVE
Hepatitis B Surface Ag: NEGATIVE

## 2021-01-12 LAB — HCV INTERPRETATION

## 2021-01-12 LAB — HGB A1C W/O EAG: Hgb A1c MFr Bld: 7.3 % — ABNORMAL HIGH (ref 4.8–5.6)

## 2021-01-12 LAB — SPECIMEN STATUS REPORT

## 2021-01-13 NOTE — Progress Notes (Signed)
Hepatitis tests negative, A1c 7.3 in diabetes range need to see to discuss diabetes and treatment lp

## 2021-01-18 ENCOUNTER — Ambulatory Visit: Payer: Self-pay | Admitting: Legal Medicine

## 2021-05-13 ENCOUNTER — Other Ambulatory Visit: Payer: Self-pay

## 2021-05-13 ENCOUNTER — Ambulatory Visit: Payer: Self-pay | Admitting: Legal Medicine

## 2021-05-13 ENCOUNTER — Encounter: Payer: Self-pay | Admitting: Legal Medicine

## 2021-05-13 VITALS — BP 140/90 | HR 77 | Temp 98.8°F | Resp 16 | Ht 74.0 in | Wt 315.0 lb

## 2021-05-13 DIAGNOSIS — R7309 Other abnormal glucose: Secondary | ICD-10-CM

## 2021-05-13 DIAGNOSIS — F419 Anxiety disorder, unspecified: Secondary | ICD-10-CM

## 2021-05-13 DIAGNOSIS — R739 Hyperglycemia, unspecified: Secondary | ICD-10-CM

## 2021-05-13 DIAGNOSIS — Z6841 Body Mass Index (BMI) 40.0 and over, adult: Secondary | ICD-10-CM

## 2021-05-13 DIAGNOSIS — K219 Gastro-esophageal reflux disease without esophagitis: Secondary | ICD-10-CM

## 2021-05-13 LAB — CBC WITH DIFFERENTIAL/PLATELET
Basophils Absolute: 0.1 10*3/uL (ref 0.0–0.2)
Basos: 1 %
EOS (ABSOLUTE): 0.2 10*3/uL (ref 0.0–0.4)
Eos: 3 %
Hematocrit: 43.9 % (ref 37.5–51.0)
Hemoglobin: 15.2 g/dL (ref 13.0–17.7)
Immature Grans (Abs): 0 10*3/uL (ref 0.0–0.1)
Immature Granulocytes: 0 %
Lymphocytes Absolute: 1.7 10*3/uL (ref 0.7–3.1)
Lymphs: 37 %
MCH: 29.6 pg (ref 26.6–33.0)
MCHC: 34.6 g/dL (ref 31.5–35.7)
MCV: 86 fL (ref 79–97)
Monocytes Absolute: 0.2 10*3/uL (ref 0.1–0.9)
Monocytes: 5 %
Neutrophils Absolute: 2.5 10*3/uL (ref 1.4–7.0)
Neutrophils: 54 %
Platelets: 252 10*3/uL (ref 150–450)
RBC: 5.13 x10E6/uL (ref 4.14–5.80)
RDW: 11.4 % — ABNORMAL LOW (ref 11.6–15.4)
WBC: 4.6 10*3/uL (ref 3.4–10.8)

## 2021-05-13 LAB — COMPREHENSIVE METABOLIC PANEL
ALT: 48 IU/L — ABNORMAL HIGH (ref 0–44)
AST: 45 IU/L — ABNORMAL HIGH (ref 0–40)
Albumin/Globulin Ratio: 2.1 (ref 1.2–2.2)
Albumin: 4.8 g/dL (ref 4.0–5.0)
Alkaline Phosphatase: 88 IU/L (ref 44–121)
BUN/Creatinine Ratio: 11 (ref 9–20)
BUN: 11 mg/dL (ref 6–24)
Bilirubin Total: 2.4 mg/dL — ABNORMAL HIGH (ref 0.0–1.2)
CO2: 22 mmol/L (ref 20–29)
Calcium: 9.8 mg/dL (ref 8.7–10.2)
Chloride: 102 mmol/L (ref 96–106)
Creatinine, Ser: 1.01 mg/dL (ref 0.76–1.27)
Globulin, Total: 2.3 g/dL (ref 1.5–4.5)
Glucose: 118 mg/dL — ABNORMAL HIGH (ref 70–99)
Potassium: 4.4 mmol/L (ref 3.5–5.2)
Sodium: 137 mmol/L (ref 134–144)
Total Protein: 7.1 g/dL (ref 6.0–8.5)
eGFR: 96 mL/min/{1.73_m2} (ref >59)

## 2021-05-13 LAB — HEMOGLOBIN A1C
Est. average glucose Bld gHb Est-mCnc: 151 mg/dL
Hgb A1c MFr Bld: 6.9 % — ABNORMAL HIGH (ref 4.8–5.6)

## 2021-05-13 MED ORDER — ESCITALOPRAM OXALATE 20 MG PO TABS: 20.0000 mg | ORAL_TABLET | Freq: Every day | ORAL | 3 refills | Status: DC

## 2021-05-13 NOTE — Progress Notes (Signed)
Subjective:  Patient ID: Eric [name], male    DOB: 1981-05-02  Age: 40 y.o. MRN: [id number]  Chief Complaint  Patient presents with   Anxiety   Hyperglycemia    HPI: patient has A1c  7.3 at last visit, he cut down diet and weight  He has chronic anxiety is using ativan, fluoxetine was stopped over 3 months.  He is now having panic attacks.   Current Outpatient Medications on File Prior to Visit  Medication Sig Dispense Refill   lansoprazole (PREVACID) 15 MG capsule Take 1 capsule (15 mg total) by mouth daily. 30 capsule 6   LORazepam (ATIVAN) 0.5 MG tablet Take 1 tablet (0.5 mg total) by mouth daily as needed. for anxiety 30 tablet 3   No current facility-administered medications on file prior to visit.   Past Medical History:  Diagnosis Date   Anxiety    GERD (gastroesophageal reflux disease)    Tibial plateau fracture, right 08/30/2015   Past Surgical History:  Procedure Laterality Date   ORIF TIBIA PLATEAU Right 09/03/2015   Procedure: OPEN REDUCTION INTERNAL FIXATION (ORIF) TIBIAL PLATEAU, POSSIBLE REAMED INTRAMEDULLARY ASPIRATE OR ILIAC CREST GRAFTING;  Surgeon: Myrene Galas, MD;  Location: [facility];  Service: Orthopedics;  Laterality: Right;   TONSILLECTOMY AND ADENOIDECTOMY      History reviewed. No pertinent family history. Social History   Socioeconomic History   Marital status: Married    Spouse name: Not on file   Number of children: Not on file   Years of education: Not on file   Highest education level: Not on file  Occupational History   Occupation: farmer   Tobacco Use   Smoking status: Never   Smokeless tobacco: Former   Tobacco comments:    3-4 cans dip/week   Substance and Sexual Activity   Alcohol use: No    Alcohol/week: 0.0 standard drinks   Drug use: No   Sexual activity: Not on file  Other Topics Concern   Not on file  Social History Narrative   Not on file   Social Determinants of Health   Financial Resource Strain: Not on file  Food  Insecurity: Not on file  Transportation Needs: Not on file  Physical Activity: Not on file  Stress: Not on file  Social Connections: Not on file    Review of Systems  Constitutional:  Negative for chills, fatigue, fever and unexpected weight change.  HENT:  Negative for congestion, ear pain, sinus pain and sore throat.   Respiratory:  Negative for cough and shortness of breath.   Cardiovascular:  Negative for chest pain and palpitations.  Gastrointestinal:  Negative for abdominal pain, blood in stool, constipation, diarrhea, nausea and vomiting.  Endocrine: Negative for polydipsia.  Genitourinary:  Negative for dysuria.  Musculoskeletal:  Negative for back pain.  Skin:  Negative for rash.  Neurological:  Negative for headaches.  Psychiatric/Behavioral:  Positive for agitation and dysphoric mood.     Objective:  BP 140/90    Pulse 77    Temp 98.8 F (37.1 C)    Resp 16    Ht 6\' 2"  (1.88 m)    Wt (!) 315 lb (142.9 kg)    SpO2 96%    BMI 40.44 kg/m   BP/Weight 05/13/2021 01/09/2021 01/16/2020  Systolic BP 140 132 140  Diastolic BP 90 84 90  Wt. (Lbs) 315 329.8 341.8  BMI 40.44 42.34 43.88    Physical Exam Vitals reviewed.  Constitutional:      Appearance:  Normal appearance. He is obese.  HENT:     Head: Normocephalic.     Right Ear: Tympanic membrane, ear canal and external ear normal.     Left Ear: Tympanic membrane, ear canal and external ear normal.     Mouth/Throat:     Mouth: Mucous membranes are moist.     Pharynx: Oropharynx is clear.  Eyes:     Extraocular Movements: Extraocular movements intact.     Conjunctiva/sclera: Conjunctivae normal.     Pupils: Pupils are equal, round, and reactive to light.  Cardiovascular:     Rate and Rhythm: Normal rate and regular rhythm.     Pulses: Normal pulses.     Heart sounds: No murmur heard.   No gallop.  Pulmonary:     Effort: Pulmonary effort is normal. No respiratory distress.     Breath sounds: Normal breath sounds.  No wheezing.  Abdominal:     General: Abdomen is flat. Bowel sounds are normal. There is no distension.     Palpations: Abdomen is soft.     Tenderness: There is no abdominal tenderness.  Musculoskeletal:        General: Normal range of motion.     Cervical back: Normal range of motion and neck supple.     Right lower leg: No edema.     Left lower leg: No edema.  Skin:    General: Skin is warm and dry.     Capillary Refill: Capillary refill takes less than 2 seconds.  Neurological:     General: No focal deficit present.     Mental Status: He is alert and oriented to person, place, and time. Mental status is at baseline.     Gait: Gait normal.     Deep Tendon Reflexes: Reflexes normal.  Psychiatric:        Mood and Affect: Mood normal.        Thought Content: Thought content normal.    Diabetic Foot Exam - Simple   Simple Foot Form Diabetic Foot exam was performed with the following findings: Yes 05/13/2021 10:30 AM  Visual Inspection No deformities, no ulcerations, no other skin breakdown bilaterally: Yes Sensation Testing Intact to touch and monofilament testing bilaterally: Yes Pulse Check Posterior Tibialis and Dorsalis pulse intact bilaterally: Yes Comments      Lab Results  Component Value Date   WBC 5.9 01/09/2021   HGB 14.0 01/09/2021   HCT 41.2 01/09/2021   PLT 257 01/09/2021   GLUCOSE 103 (H) 01/09/2021   CHOL 148 01/09/2021   TRIG 161 (H) 01/09/2021   HDL 38 (L) 01/09/2021   LDLCALC 82 01/09/2021   ALT 110 (H) 01/09/2021   AST 102 (H) 01/09/2021   NA 140 01/09/2021   K 4.5 01/09/2021   CL 100 01/09/2021   CREATININE 0.97 01/09/2021   BUN 11 01/09/2021   CO2 25 01/09/2021   INR 1.03 09/03/2015   HGBA1C 7.3 (H) 01/09/2021      Assessment & Plan:   Problem List Items Addressed This Visit       Digestive   GERD (gastroesophageal reflux disease) Plan of care was formulated today.  She is doing well.  A plan of care was formulated using patient  exam, tests and other sources to optimize care using evidence based information.  Recommend no smoking, no eating after supper, avoid fatty foods, elevate Head of bed, avoid tight fitting clothing.  Continue on prevacid.      Other   Anxiety -  Primary chronic anxiety with panic disorder    Morbid obesity (HCC) An individualize plan was formulated for obesity using patient history and physical exam to encourage weight loss.  An evidence based program was formulated.  Patient is to cut portion size with meals and to plan physical exercise 3 days a week at least 20 minutes.  Weight watchers and other programs are helpful.  Planned amount of weight loss 10 lbs.     BMI 40.0-44.9, adult [facility]) An individualize plan was formulated for obesity using patient history and physical exam to encourage weight loss.  An evidence based program was formulated.  Patient is to cut portion size with meals and to plan physical exercise 3 days a week at least 20 minutes.  Weight watchers and other programs are helpful.  Planned amount of weight loss 10 lbs.    Hyperglycemia Patient has elevated hyperglycemia and dietary changes and weight loss   Other Visit Diagnoses     Elevated glucose level       Relevant Orders   Comprehensive metabolic panel   Hemoglobin A1c   CBC with Differential/Platelet Elevated glucose check A1c     .  Meds ordered this encounter  Medications   DISCONTD: escitalopram (LEXAPRO) 20 MG tablet    Sig: Take 1 tablet (20 mg total) by mouth daily.    Dispense:  30 tablet    Refill:  3    Orders Placed This Encounter  Procedures   Comprehensive metabolic panel   Hemoglobin A1c   CBC with Differential/Platelet     Follow-up: Return in about 1 month (around 06/13/2021).  An After Visit Summary was printed and given to the patient.  Brent Bulla, MD [facility] [phone]

## 2021-05-14 NOTE — Progress Notes (Signed)
Glucose 118, kidney tests normal, liver tests lower, A1c 6.9 good, CBC normal lp

## 2021-05-15 ENCOUNTER — Other Ambulatory Visit: Payer: Self-pay | Admitting: Legal Medicine

## 2021-05-15 ENCOUNTER — Telehealth: Payer: Self-pay

## 2021-05-15 DIAGNOSIS — F419 Anxiety disorder, unspecified: Secondary | ICD-10-CM

## 2021-05-15 MED ORDER — ESCITALOPRAM OXALATE 5 MG PO TABS
20.0000 mg | ORAL_TABLET | Freq: Every day | ORAL | 2 refills | Status: DC
Start: 2021-05-15 — End: 2021-08-13

## 2021-05-15 NOTE — Telephone Encounter (Signed)
Patient calling and states he took one tablet of lexapro 20 mg on Monday. Yesterday he felt lightheaded, jittery, dizzy, loss of appetite. Did not take tablet yesterday. He did speak with pharmacist and they recommended he drop back to 5 by cutting pills. He did quarter his pill today (5 mg) this morning and as of now feels okay. He is asking for lower dose of lexapro either 5 mg or 10 mg.    Eric [name] 05/15/21 9:36 AM

## 2021-05-18 ENCOUNTER — Other Ambulatory Visit: Payer: Self-pay | Admitting: Family Medicine

## 2021-05-18 MED ORDER — MOLNUPIRAVIR EUA 200MG CAPSULE: 4.0000 | ORAL_CAPSULE | Freq: Two times a day (BID) | ORAL | 0 refills | Status: AC

## 2021-06-17 ENCOUNTER — Ambulatory Visit: Payer: Self-pay | Admitting: Legal Medicine

## 2021-06-17 ENCOUNTER — Encounter: Payer: Self-pay | Admitting: Legal Medicine

## 2021-06-17 ENCOUNTER — Other Ambulatory Visit: Payer: Self-pay

## 2021-06-17 VITALS — BP 100/70 | HR 85 | Temp 99.1°F | Resp 15 | Ht 74.0 in | Wt 295.0 lb

## 2021-06-17 DIAGNOSIS — E119 Type 2 diabetes mellitus without complications: Secondary | ICD-10-CM

## 2021-06-17 DIAGNOSIS — F419 Anxiety disorder, unspecified: Secondary | ICD-10-CM

## 2021-06-17 NOTE — Progress Notes (Signed)
Subjective:  Patient ID: Eric [name], male    DOB: 1981/03/20  Age: 41 y.o. MRN: [id number]  Chief Complaint  Patient presents with   Anxiety    HPI Patient is taking lexapro in am and feeling better.    He has DM.  A1c 6.9- he needs education.needs diet control and exercise   Current Outpatient Medications on File Prior to Visit  Medication Sig Dispense Refill   escitalopram (LEXAPRO) 5 MG tablet Take 4 tablets (20 mg total) by mouth daily. 30 tablet 2   lansoprazole (PREVACID) 15 MG capsule Take 1 capsule (15 mg total) by mouth daily. 30 capsule 6   LORazepam (ATIVAN) 0.5 MG tablet Take 1 tablet (0.5 mg total) by mouth daily as needed. for anxiety 30 tablet 3   No current facility-administered medications on file prior to visit.   Past Medical History:  Diagnosis Date   Anxiety    GERD (gastroesophageal reflux disease)    Tibial plateau fracture, right 08/30/2015   Past Surgical History:  Procedure Laterality Date   ORIF TIBIA PLATEAU Right 09/03/2015   Procedure: OPEN REDUCTION INTERNAL FIXATION (ORIF) TIBIAL PLATEAU, POSSIBLE REAMED INTRAMEDULLARY ASPIRATE OR ILIAC CREST GRAFTING;  Surgeon: Altamese St. Clair, MD;  Location: [place];  Service: Orthopedics;  Laterality: Right;   TONSILLECTOMY AND ADENOIDECTOMY      No family history on file. Social History   Socioeconomic History   Marital status: Married    Spouse name: Not on file   Number of children: Not on file   Years of education: Not on file   Highest education level: Not on file  Occupational History   Occupation: farmer   Tobacco Use   Smoking status: Never   Smokeless tobacco: Former   Tobacco comments:    3-4 cans dip/week   Substance and Sexual Activity   Alcohol use: No    Alcohol/week: 0.0 standard drinks   Drug use: No   Sexual activity: Not on file  Other Topics Concern   Not on file  Social History Narrative   Not on file   Social Determinants of Health   Financial Resource Strain: Not on  file  Food Insecurity: Not on file  Transportation Needs: Not on file  Physical Activity: Not on file  Stress: Not on file  Social Connections: Not on file    Review of Systems  Constitutional:  Negative for chills, fatigue, fever and unexpected weight change.  HENT:  Negative for congestion, ear pain, sinus pain and sore throat.   Eyes:  Negative for visual disturbance.  Respiratory:  Negative for cough and shortness of breath.   Cardiovascular:  Negative for chest pain and palpitations.  Gastrointestinal:  Negative for abdominal pain, blood in stool, constipation, diarrhea, nausea and vomiting.  Endocrine: Negative for polydipsia.  Genitourinary:  Negative for dysuria.  Musculoskeletal:  Negative for back pain.  Skin:  Negative for rash.  Neurological:  Negative for headaches.  Psychiatric/Behavioral:  The patient is not nervous/anxious.     Objective:  BP 100/70    Pulse 85    Temp 99.1 F (37.3 C)    Resp 15    Ht 6\' 2"  (1.88 m)    Wt 295 lb (133.8 kg)    SpO2 98%    BMI 37.88 kg/m   BP/Weight 06/17/2021 05/13/2021 [id number]  Systolic BP [id number]  Diastolic BP 70 90 84  Wt. (Lbs) 295 315 329.8  BMI 37.88 40.44 42.34    Physical Exam  Vitals reviewed.  Constitutional:      Appearance: Normal appearance. He is obese.  HENT:     Right Ear: Tympanic membrane normal.     Left Ear: Tympanic membrane normal.     Mouth/Throat:     Mouth: Mucous membranes are moist.     Pharynx: Oropharynx is clear.  Eyes:     Extraocular Movements: Extraocular movements intact.     Conjunctiva/sclera: Conjunctivae normal.     Pupils: Pupils are equal, round, and reactive to light.  Cardiovascular:     Rate and Rhythm: Normal rate and regular rhythm.     Pulses: Normal pulses.     Heart sounds: Normal heart sounds. No murmur heard.   No gallop.  Pulmonary:     Effort: Pulmonary effort is normal. No respiratory distress.     Breath sounds: Normal breath sounds. No wheezing.   Abdominal:     General: Abdomen is flat. Bowel sounds are normal. There is no distension.     Palpations: Abdomen is soft.     Tenderness: There is no abdominal tenderness.  Musculoskeletal:        General: Normal range of motion.     Cervical back: Normal range of motion.  Skin:    General: Skin is warm.     Capillary Refill: Capillary refill takes less than 2 seconds.  Neurological:     General: No focal deficit present.     Mental Status: He is alert and oriented to person, place, and time. Mental status is at baseline.    Diabetic Foot Exam - Simple   Simple Foot Form Diabetic Foot exam was performed with the following findings: Yes 06/17/2021  3:08 PM  Visual Inspection No deformities, no ulcerations, no other skin breakdown bilaterally: Yes Sensation Testing Intact to touch and monofilament testing bilaterally: Yes Pulse Check Posterior Tibialis and Dorsalis pulse intact bilaterally: Yes Comments     Depression screen [facility] 2/9 06/17/2021 01/09/2021 01/16/2020  Decreased Interest 2 0 0  Down, Depressed, Hopeless 2 0 0  PHQ - 2 Score 4 0 0  Altered sleeping 3 - 0  Tired, decreased energy 2 - 0  Change in appetite 3 - 0  Feeling bad or failure about yourself  2 - 0  Trouble concentrating 1 - 0  Moving slowly or fidgety/restless 0 - 0  Suicidal thoughts 1 - 0  PHQ-9 Score 16 - 0  Difficult doing work/chores Not difficult at all - Not difficult at all     Lab Results  Component Value Date   WBC 4.6 05/13/2021   HGB 15.2 05/13/2021   HCT 43.9 05/13/2021   PLT 252 05/13/2021   GLUCOSE 118 (H) 05/13/2021   CHOL 148 01/09/2021   TRIG 161 (H) 01/09/2021   HDL 38 (L) 01/09/2021   LDLCALC 82 01/09/2021   ALT 48 (H) 05/13/2021   AST 45 (H) 05/13/2021   NA 137 05/13/2021   K 4.4 05/13/2021   CL 102 05/13/2021   CREATININE 1.01 05/13/2021   BUN 11 05/13/2021   CO2 22 05/13/2021   INR 1.03 09/03/2015   HGBA1C 6.9 (H) 05/13/2021      Assessment & Plan:    Problem List Items Addressed This Visit       Endocrine   Diabetes mellitus without complication ([place])   Relevant Orders   Microalbumin/Creatinine Ratio, Urine Diabetes mellitus was discussed at length and he was given information on diet and exercise try to keep it under control  we will hold off on any medicines at the present time but we will do a microalbumin.     Other   Anxiety - Primary Patient's anxiety and depression actually has improved when he takes his Lexapro in the morning.  He has been extremely tired after having COVID about a month ago and we discussed the his longer term symptoms from [place] which should resolve on their own.  .    Orders Placed This Encounter  Procedures   Microalbumin/Creatinine Ratio, Urine     Follow-up: Return in about 3 months (around 09/15/2021).  An After Visit Summary was printed and given to the patient.  Reinaldo Meeker, MD [facility] [phone]

## 2021-06-18 LAB — MICROALBUMIN / CREATININE URINE RATIO
Creatinine, Urine: 256.7 mg/dL
Microalb/Creat Ratio: 6 mg/g creat (ref 0–29)
Microalbumin, Urine: 14.9 ug/mL

## 2021-06-19 NOTE — Progress Notes (Signed)
Microalbuminuria normal lp

## 2021-08-13 ENCOUNTER — Other Ambulatory Visit: Payer: Self-pay | Admitting: Legal Medicine

## 2021-08-13 DIAGNOSIS — F419 Anxiety disorder, unspecified: Secondary | ICD-10-CM

## 2021-09-17 ENCOUNTER — Encounter: Payer: Self-pay | Admitting: Legal Medicine

## 2021-09-17 ENCOUNTER — Ambulatory Visit (INDEPENDENT_AMBULATORY_CARE_PROVIDER_SITE_OTHER): Payer: Self-pay | Admitting: Legal Medicine

## 2021-09-17 VITALS — BP 102/80 | HR 74 | Temp 98.4°F | Resp 15 | Ht 74.0 in | Wt 291.0 lb

## 2021-09-17 DIAGNOSIS — F419 Anxiety disorder, unspecified: Secondary | ICD-10-CM

## 2021-09-17 DIAGNOSIS — E119 Type 2 diabetes mellitus without complications: Secondary | ICD-10-CM

## 2021-09-17 DIAGNOSIS — K219 Gastro-esophageal reflux disease without esophagitis: Secondary | ICD-10-CM

## 2021-09-17 DIAGNOSIS — Z6837 Body mass index (BMI) 37.0-37.9, adult: Secondary | ICD-10-CM

## 2021-09-17 DIAGNOSIS — Z6839 Body mass index (BMI) 39.0-39.9, adult: Secondary | ICD-10-CM | POA: Insufficient documentation

## 2021-09-17 LAB — COMPREHENSIVE METABOLIC PANEL
ALT: 29 IU/L (ref 0–44)
AST: 40 IU/L (ref 0–40)
Albumin/Globulin Ratio: 2 (ref 1.2–2.2)
Albumin: 4.8 g/dL (ref 4.0–5.0)
Alkaline Phosphatase: 79 IU/L (ref 44–121)
BUN/Creatinine Ratio: 16 (ref 9–20)
BUN: 15 mg/dL (ref 6–24)
Bilirubin Total: 2.1 mg/dL — ABNORMAL HIGH (ref 0.0–1.2)
CO2: 21 mmol/L (ref 20–29)
Calcium: 9.6 mg/dL (ref 8.7–10.2)
Chloride: 102 mmol/L (ref 96–106)
Creatinine, Ser: 0.96 mg/dL (ref 0.76–1.27)
Globulin, Total: 2.4 g/dL (ref 1.5–4.5)
Glucose: 117 mg/dL — ABNORMAL HIGH (ref 70–99)
Potassium: 4.4 mmol/L (ref 3.5–5.2)
Sodium: 137 mmol/L (ref 134–144)
Total Protein: 7.2 g/dL (ref 6.0–8.5)
eGFR: 102 mL/min/{1.73_m2} (ref >59)

## 2021-09-17 LAB — CBC WITH DIFFERENTIAL/PLATELET
Basophils Absolute: 0 10*3/uL (ref 0.0–0.2)
Basos: 1 %
EOS (ABSOLUTE): 0.1 10*3/uL (ref 0.0–0.4)
Eos: 4 %
Hematocrit: 45.3 % (ref 37.5–51.0)
Hemoglobin: 15.7 g/dL (ref 13.0–17.7)
Immature Grans (Abs): 0 10*3/uL (ref 0.0–0.1)
Immature Granulocytes: 0 %
Lymphocytes Absolute: 1.4 10*3/uL (ref 0.7–3.1)
Lymphs: 37 %
MCH: 30.3 pg (ref 26.6–33.0)
MCHC: 34.7 g/dL (ref 31.5–35.7)
MCV: 88 fL (ref 79–97)
Monocytes Absolute: 0.2 10*3/uL (ref 0.1–0.9)
Monocytes: 6 %
Neutrophils Absolute: 2 10*3/uL (ref 1.4–7.0)
Neutrophils: 52 %
Platelets: 234 10*3/uL (ref 150–450)
RBC: 5.18 x10E6/uL (ref 4.14–5.80)
RDW: 12.4 % (ref 11.6–15.4)
WBC: 3.7 10*3/uL (ref 3.4–10.8)

## 2021-09-17 LAB — HEMOGLOBIN A1C
Est. average glucose Bld gHb Est-mCnc: 131 mg/dL
Hgb A1c MFr Bld: 6.2 % — ABNORMAL HIGH (ref 4.8–5.6)

## 2021-09-17 NOTE — Progress Notes (Signed)
? ?Subjective:  ?Patient ID: Eric [name], male    DOB: 27-Oct-1980  Age: 41 y.o. MRN: [id number] ? ?Chief Complaint  ?Patient presents with  ? Anxiety  ? ? ?HPI ? Patient is here for new medication follow up. He started lexapro one month ago. He mentioned to have side effects, but he said is helping. He takes ativan only as needed.He is doing well on 5mg , he is less anxious.  He is getting around. ? ?Patient present with type 2 diabetes.  Specifically, this is type 2, noninsulin requiring diabetes, complicated by no complications.  Compliance with treatment has been good; patient take medicines as directed, maintains diet and exercise regimen, follows up as directed, and is keeping glucose diary.  Date of  diagnosis 2020.  Depression screen has been performed.Tobacco screen nonsmoker. Current medicines for diabetes diet.  Patient is on none for renal protection and none for cholesterol control.  Patient performs foot exams daily and last ophthalmologic exam was need eye exam ? ?Patient had covid- with long symptoms and weight loss ?Current Outpatient Medications on File Prior to Visit  ?Medication Sig Dispense Refill  ? escitalopram (LEXAPRO) 5 MG tablet TAKE 4 TABLETS(20 MG) BY MOUTH DAILY 30 tablet 6  ? LORazepam (ATIVAN) 0.5 MG tablet Take 1 tablet (0.5 mg total) by mouth daily as needed. for anxiety 30 tablet 3  ? ?No current facility-administered medications on file prior to visit.  ? ?Past Medical History:  ?Diagnosis Date  ? Anxiety   ? GERD (gastroesophageal reflux disease)   ? Tibial plateau fracture, right 08/30/2015  ? ?Past Surgical History:  ?Procedure Laterality Date  ? ORIF TIBIA PLATEAU Right 09/03/2015  ? Procedure: OPEN REDUCTION INTERNAL FIXATION (ORIF) TIBIAL PLATEAU, POSSIBLE REAMED INTRAMEDULLARY ASPIRATE OR ILIAC CREST GRAFTING;  Surgeon: 11/03/2015, MD;  Location: [facility] OR;  Service: Orthopedics;  Laterality: Right;  ? TONSILLECTOMY AND ADENOIDECTOMY    ?  ?History reviewed. No pertinent family  history. ?Social History  ? ?Socioeconomic History  ? Marital status: Married  ?  Spouse name: Not on file  ? Number of children: Not on file  ? Years of education: Not on file  ? Highest education level: Not on file  ?Occupational History  ? Occupation: farmer   ?Tobacco Use  ? Smoking status: Never  ? Smokeless tobacco: Former  ? Tobacco comments:  ?  3-4 cans dip/week   ?Substance and Sexual Activity  ? Alcohol use: No  ?  Alcohol/week: 0.0 standard drinks  ? Drug use: No  ? Sexual activity: Not on file  ?Other Topics Concern  ? Not on file  ?Social History Narrative  ? Not on file  ? ?Social Determinants of Health  ? ?Financial Resource Strain: Not on file  ?Food Insecurity: Not on file  ?Transportation Needs: Not on file  ?Physical Activity: Not on file  ?Stress: Not on file  ?Social Connections: Not on file  ? ? ?Review of Systems  ?Constitutional:  Negative for chills, fatigue, fever and unexpected weight change.  ?HENT:  Negative for congestion, ear pain, sinus pain and sore throat.   ?Respiratory:  Negative for cough and shortness of breath.   ?Cardiovascular:  Negative for chest pain and palpitations.  ?Gastrointestinal:  Negative for abdominal pain, blood in stool, constipation, diarrhea, nausea and vomiting.  ?Endocrine: Negative for polydipsia.  ?Genitourinary:  Negative for dysuria.  ?Musculoskeletal:  Negative for back pain.  ?Skin:  Negative for rash.  ?Neurological:  Negative for headaches.  ?  Psychiatric/Behavioral:  Negative for agitation, sleep disturbance and suicidal ideas. The patient is nervous/anxious.   ? ? ?Objective:  ?BP 102/80   Pulse 74   Temp 98.4 ?F (36.9 ?C)   Resp 15   Ht 6\' 2"  (1.88 m)   Wt 291 lb (132 kg)   SpO2 98%   BMI 37.36 kg/m?  ? ? ?  09/17/2021  ?  9:19 AM 06/17/2021  ?  2:42 PM 05/13/2021  ?  9:58 AM  ?BP/Weight  ?Systolic BP 102 100 140  ?Diastolic BP 80 70 90  ?Wt. (Lbs) 291 295 315  ?BMI 37.36 kg/m2 37.88 kg/m2 40.44 kg/m2  ? ? ?Physical Exam ?Vitals reviewed.   ?Constitutional:   ?   Appearance: Normal appearance. He is obese.  ?HENT:  ?   Head: Normocephalic.  ?   Right Ear: Tympanic membrane normal.  ?   Left Ear: Tympanic membrane normal.  ?   Nose: Nose normal.  ?   Mouth/Throat:  ?   Mouth: Mucous membranes are moist.  ?Eyes:  ?   Extraocular Movements: Extraocular movements intact.  ?   Conjunctiva/sclera: Conjunctivae normal.  ?   Pupils: Pupils are equal, round, and reactive to light.  ?Cardiovascular:  ?   Rate and Rhythm: Normal rate and regular rhythm.  ?   Pulses: Normal pulses.  ?   Heart sounds: Normal heart sounds. No murmur heard. ?  No gallop.  ?Pulmonary:  ?   Effort: Pulmonary effort is normal. No respiratory distress.  ?   Breath sounds: Normal breath sounds. No wheezing.  ?Abdominal:  ?   General: Abdomen is flat. Bowel sounds are normal. There is no distension.  ?   Tenderness: There is no abdominal tenderness.  ?Musculoskeletal:     ?   General: Normal range of motion.  ?   Cervical back: Normal range of motion and neck supple.  ?   Right lower leg: No edema.  ?   Left lower leg: No edema.  ?Skin: ?   General: Skin is warm.  ?   Capillary Refill: Capillary refill takes less than 2 seconds.  ?Neurological:  ?   General: No focal deficit present.  ?   Mental Status: He is alert.  ?   Gait: Gait normal.  ?   Deep Tendon Reflexes: Reflexes normal.  ?Psychiatric:     ?   Mood and Affect: Mood normal.     ?   Thought Content: Thought content normal.     ?   Judgment: Judgment normal.  ? ? ?  09/17/2021  ?  9:38 AM 06/17/2021  ?  3:04 PM 01/09/2021  ?  1:59 PM 01/16/2020  ?  8:16 AM  ?Depression screen PHQ 2/9  ?Decreased Interest 0 2 0 0  ?Down, Depressed, Hopeless 0 2 0 0  ?PHQ - 2 Score 0 4 0 0  ?Altered sleeping 0 3  0  ?Tired, decreased energy 0 2  0  ?Change in appetite 0 3  0  ?Feeling bad or failure about yourself  0 2  0  ?Trouble concentrating 0 1  0  ?Moving slowly or fidgety/restless 0 0  0  ?Suicidal thoughts 0 1  0  ?PHQ-9 Score 0 16  0   ?Difficult doing work/chores Not difficult at all Not difficult at all  Not difficult at all  ? ? ?Diabetic Foot Exam - Simple   ?Simple Foot Form ?Diabetic Foot exam was performed with the  following findings: Yes 09/17/2021  9:49 AM  ?Visual Inspection ?No deformities, no ulcerations, no other skin breakdown bilaterally: Yes ?Sensation Testing ?Intact to touch and monofilament testing bilaterally: Yes ?Pulse Check ?Posterior Tibialis and Dorsalis pulse intact bilaterally: Yes ?Comments ?  ?  ? ?Lab Results  ?Component Value Date  ? WBC 4.6 05/13/2021  ? HGB 15.2 05/13/2021  ? HCT 43.9 05/13/2021  ? PLT 252 05/13/2021  ? GLUCOSE 118 (H) 05/13/2021  ? CHOL 148 01/09/2021  ? TRIG 161 (H) 01/09/2021  ? HDL 38 (L) 01/09/2021  ? LDLCALC 82 01/09/2021  ? ALT 48 (H) 05/13/2021  ? AST 45 (H) 05/13/2021  ? NA 137 05/13/2021  ? K 4.4 05/13/2021  ? CL 102 05/13/2021  ? CREATININE 1.01 05/13/2021  ? BUN 11 05/13/2021  ? CO2 22 05/13/2021  ? INR 1.03 09/03/2015  ? HGBA1C 6.9 (H) 05/13/2021  ? ? ? ? ?Assessment & Plan:  ? ?Problem List Items Addressed This Visit   ? ?  ? Digestive  ? GERD (gastroesophageal reflux disease)  ? Relevant Orders  ? CBC with Differential/Platelet  ? Comprehensive metabolic panel ?Plan of care was formulated today.  he is doing well.  A plan of care was formulated using patient exam, tests and other sources to optimize care using evidence based information.  Recommend no smoking, no eating after supper, avoid fatty foods, elevate Head of bed, avoid tight fitting clothing.  Continue on OTC.   ?  ? Endocrine  ? Diabetes mellitus without complication (HCC)  ? Relevant Orders  ? CBC with Differential/Platelet  ? Comprehensive metabolic panel  ? Hemoglobin A1c ?An individual care plan for diabetes was established and reinforced today.  The patient's status was assessed using clinical findings on exam, labs and diagnostic testing. Patient success at meeting goals based on disease specific evidence-based  guidelines and found to be good controlled. ?Medications were assessed and patient's understanding of the medical issues , including barriers were assessed. ?Recommend adherence to a diabetic diet, a graduated exercise progra

## 2021-09-18 NOTE — Progress Notes (Signed)
Glucose 117, kidney tests normal, liver tests now normal, A1c 6.2 OK, CBC normal,  ?lp

## 2022-03-14 ENCOUNTER — Other Ambulatory Visit: Payer: Self-pay | Admitting: Legal Medicine

## 2022-03-14 DIAGNOSIS — F419 Anxiety disorder, unspecified: Secondary | ICD-10-CM

## 2022-03-18 NOTE — Progress Notes (Signed)
Subjective:  Patient ID: Eric [name], male    DOB: July 01, 1980  Age: 41 y.o. MRN: [id number]  Chief Complaint  Patient presents with   Anxiety   Diabetes    HPI   Patient present with type 2 diabetes.  Compliance with treatment has been good; patient take medicines as directed, maintains diet and exercise regimen, follows up as directed, and is keeping glucose diary.  Current medicines for diabetes none. Patient performs foot exams daily and last ophthalmologic exam was few years ago. He is on diet but no medicines.  Anxiety: Taking Lorazepam 0.5 mg daily PRN, Lexapro 5 mg daily.  Current Outpatient Medications on File Prior to Visit  Medication Sig Dispense Refill   LORazepam (ATIVAN) 0.5 MG tablet Take 1 tablet (0.5 mg total) by mouth daily as needed. for anxiety 30 tablet 3   No current facility-administered medications on file prior to visit.   Past Medical History:  Diagnosis Date   Anxiety    GERD (gastroesophageal reflux disease)    Tibial plateau fracture, right 08/30/2015   Past Surgical History:  Procedure Laterality Date   ORIF TIBIA PLATEAU Right 09/03/2015   Procedure: OPEN REDUCTION INTERNAL FIXATION (ORIF) TIBIAL PLATEAU, POSSIBLE REAMED INTRAMEDULLARY ASPIRATE OR ILIAC CREST GRAFTING;  Surgeon: Myrene Galas, MD;  Location: [facility] OR;  Service: Orthopedics;  Laterality: Right;   TONSILLECTOMY AND ADENOIDECTOMY      History reviewed. No pertinent family history. Social History   Socioeconomic History   Marital status: Married    Spouse name: Not on file   Number of children: Not on file   Years of education: Not on file   Highest education level: Not on file  Occupational History   Occupation: farmer   Tobacco Use   Smoking status: Never   Smokeless tobacco: Former   Tobacco comments:    3-4 cans dip/week   Substance and Sexual Activity   Alcohol use: No    Alcohol/week: 0.0 standard drinks of alcohol   Drug use: No   Sexual activity: Not on file  Other  Topics Concern   Not on file  Social History Narrative   Not on file   Social Determinants of Health   Financial Resource Strain: Not on file  Food Insecurity: Not on file  Transportation Needs: Not on file  Physical Activity: Not on file  Stress: Not on file  Social Connections: Not on file    Review of Systems  Constitutional:  Negative for chills, fatigue, fever and unexpected weight change.  HENT:  Negative for congestion, ear pain, sinus pain and sore throat.   Respiratory:  Negative for cough and shortness of breath.   Cardiovascular:  Negative for chest pain and palpitations.  Gastrointestinal:  Negative for abdominal pain, blood in stool, constipation, diarrhea, nausea and vomiting.  Endocrine: Negative for polydipsia.  Genitourinary:  Negative for dysuria.  Musculoskeletal:  Negative for back pain.  Skin:  Negative for rash.  Neurological:  Negative for headaches.     Objective:  BP 110/88   Pulse 73   Temp 99 F (37.2 C)   Resp 14   Ht 6\' 2"  (1.88 m)   Wt (!) 308 lb (139.7 kg)   SpO2 97%   BMI 39.54 kg/m      03/19/2022    9:02 AM 09/17/2021    9:19 AM 06/17/2021    2:42 PM  BP/Weight  Systolic BP 110 102 100  Diastolic BP 88 80 70  Wt. (Lbs) 308  291 295  BMI 39.54 kg/m2 37.36 kg/m2 37.88 kg/m2    Physical Exam Vitals reviewed.  Constitutional:      General: He is not in acute distress.    Appearance: Normal appearance. He is obese.  HENT:     Head: Normocephalic.     Right Ear: Tympanic membrane normal.     Left Ear: Tympanic membrane normal.     Nose: Nose normal.     Mouth/Throat:     Mouth: Mucous membranes are moist.     Pharynx: Oropharynx is clear.  Eyes:     Extraocular Movements: Extraocular movements intact.     Conjunctiva/sclera: Conjunctivae normal.     Pupils: Pupils are equal, round, and reactive to light.  Cardiovascular:     Rate and Rhythm: Normal rate and regular rhythm.     Pulses: Normal pulses.     Heart sounds: No  murmur heard.    No gallop.  Pulmonary:     Effort: Pulmonary effort is normal.  Abdominal:     General: Abdomen is flat. Bowel sounds are normal.     Tenderness: There is no abdominal tenderness.  Musculoskeletal:        General: Normal range of motion.     Cervical back: Normal range of motion.  Skin:    General: Skin is warm.     Capillary Refill: Capillary refill takes less than 2 seconds.  Neurological:     General: No focal deficit present.     Mental Status: He is alert and oriented to person, place, and time. Mental status is at baseline.     Gait: Gait normal.     Deep Tendon Reflexes: Reflexes normal.     Diabetic Foot Exam - Simple   Simple Foot Form Diabetic Foot exam was performed with the following findings: Yes 03/19/2022  9:36 AM  Visual Inspection No deformities, no ulcerations, no other skin breakdown bilaterally: Yes Sensation Testing Intact to touch and monofilament testing bilaterally: Yes Pulse Check Posterior Tibialis and Dorsalis pulse intact bilaterally: Yes Comments      Lab Results  Component Value Date   WBC 3.7 09/17/2021   HGB 15.7 09/17/2021   HCT 45.3 09/17/2021   PLT 234 09/17/2021   GLUCOSE 117 (H) 09/17/2021   CHOL 191 03/19/2022   TRIG 104 03/19/2022   HDL 48 03/19/2022   LDLCALC 124 (H) 03/19/2022   ALT 29 09/17/2021   AST 40 09/17/2021   NA 137 09/17/2021   K 4.4 09/17/2021   CL 102 09/17/2021   CREATININE 0.96 09/17/2021   BUN 15 09/17/2021   CO2 21 09/17/2021   INR 1.03 09/03/2015   HGBA1C 6.0 (H) 03/19/2022      Assessment & Plan:   Problem List Items Addressed This Visit       Digestive   GERD (gastroesophageal reflux disease) - Primary Plan of care was formulated today.  he is doing well.  A plan of care was formulated using patient exam, tests and other sources to optimize care using evidence based information.  Recommend no smoking, no eating after supper, avoid fatty foods, elevate Head of bed, avoid tight  fitting clothing.  Continue on OTC.      Endocrine   Diabetes mellitus without complication (HCC)   Relevant Medications   metFORMIN (GLUCOPHAGE) 500 MG tablet   atorvastatin (LIPITOR) 20 MG tablet   Other Relevant Orders   Hemoglobin A1c (Completed) An individual care plan for diabetes was established and reinforced  today.  The patient's status was assessed using clinical findings on exam, labs and diagnostic testing. Patient success at meeting goals based on disease specific evidence-based guidelines and found to be fair controlled. Medications were assessed and patient's understanding of the medical issues , including barriers were assessed. Recommend adherence to a diabetic diet, a graduated exercise program, HgbA1c level is checked quarterly, and urine microalbumin performed yearly .  Annual mono-filament sensation testing performed. Lower blood pressure and control hyperlipidemia is important. Get annual eye exams and annual flu shots and smoking cessation discussed.  Self management goals were discussed.      Other   Anxiety   Relevant Medications   escitalopram (LEXAPRO) 10 MG tablet Patient has anxiety controlled on Lexapro    Morbid obesity ([place])   Relevant Medications   metFORMIN (GLUCOPHAGE) 500 MG tablet Patient has BMI 39 with diabetes therefore meets criteria for morbid obesity   BMI 39.0-39.9,adult An individualize plan was formulated for obesity using patient history and physical exam to encourage weight loss.  An evidence based program was formulated.  Patient is to cut portion size with meals and to plan physical exercise 3 days a week at least 20 minutes.  Weight watchers and other programs are helpful.  Planned amount of weight loss 10 lbs.    Other Visit Diagnoses     Mixed hyperlipidemia       Relevant Medications   atorvastatin (LIPITOR) 20 MG tablet   Other Relevant Orders   Lipid panel (Completed) AN INDIVIDUAL CARE PLAN for hyperlipidemia/ cholesterol was  established and reinforced today.  The patient's status was assessed using clinical findings on exam, lab and other diagnostic tests. The patient's disease status was assessed based on evidence-based guidelines and found to be fair controlled. MEDICATIONS were reviewed. SELF MANAGEMENT GOALS have been discussed and patient's success at attaining the goal of low cholesterol was assessed. RECOMMENDATION given include regular exercise 3 days a week and low cholesterol/low fat diet. CLINICAL SUMMARY including written plan to identify barriers unique to the patient due to social or economic  reasons was discussed.      .       Follow-up: Return in about 6 months (around 09/18/2022).  An After Visit Summary was printed and given to the patient.  Reinaldo Meeker, MD [facility] [phone]

## 2022-03-19 ENCOUNTER — Ambulatory Visit: Payer: Self-pay | Admitting: Legal Medicine

## 2022-03-19 ENCOUNTER — Encounter: Payer: Self-pay | Admitting: Legal Medicine

## 2022-03-19 VITALS — BP 110/88 | HR 73 | Temp 99.0°F | Resp 14 | Ht 74.0 in | Wt 308.0 lb

## 2022-03-19 DIAGNOSIS — E782 Mixed hyperlipidemia: Secondary | ICD-10-CM

## 2022-03-19 DIAGNOSIS — E119 Type 2 diabetes mellitus without complications: Secondary | ICD-10-CM

## 2022-03-19 DIAGNOSIS — K219 Gastro-esophageal reflux disease without esophagitis: Secondary | ICD-10-CM

## 2022-03-19 DIAGNOSIS — Z6839 Body mass index (BMI) 39.0-39.9, adult: Secondary | ICD-10-CM

## 2022-03-19 DIAGNOSIS — F419 Anxiety disorder, unspecified: Secondary | ICD-10-CM

## 2022-03-19 MED ORDER — ATORVASTATIN CALCIUM 20 MG PO TABS: 20.0000 mg | ORAL_TABLET | Freq: Every day | ORAL | 3 refills | Status: DC

## 2022-03-19 MED ORDER — ESCITALOPRAM OXALATE 10 MG PO TABS: 10.0000 mg | ORAL_TABLET | Freq: Every day | ORAL | 2 refills | Status: DC

## 2022-03-19 MED ORDER — METFORMIN HCL 500 MG PO TABS: 500.0000 mg | ORAL_TABLET | Freq: Two times a day (BID) | ORAL | 3 refills | Status: DC

## 2022-03-20 LAB — CARDIOVASCULAR RISK ASSESSMENT

## 2022-03-20 LAB — HEMOGLOBIN A1C
Est. average glucose Bld gHb Est-mCnc: 126 mg/dL
Hgb A1c MFr Bld: 6 % — ABNORMAL HIGH (ref 4.8–5.6)

## 2022-03-20 LAB — LIPID PANEL
Chol/HDL Ratio: 4 ratio (ref 0.0–5.0)
Cholesterol, Total: 191 mg/dL (ref 100–199)
HDL: 48 mg/dL (ref >39)
LDL Chol Calc (NIH): 124 mg/dL — ABNORMAL HIGH (ref 0–99)
Triglycerides: 104 mg/dL (ref 0–149)
VLDL Cholesterol Cal: 19 mg/dL (ref 5–40)

## 2022-03-20 NOTE — Progress Notes (Signed)
A1c 6.0, LDL cholesterol high 124- is he taking atorvastatin lp

## 2022-05-14 ENCOUNTER — Other Ambulatory Visit: Payer: Self-pay | Admitting: Legal Medicine

## 2022-08-12 ENCOUNTER — Telehealth: Payer: Self-pay

## 2022-08-12 NOTE — Telephone Encounter (Signed)
Patient called stating that he was having right lower quadrant abdominal pain along with constipation/diarrhea/ felt weak also stating that he was having tenderness to the touch in that area and stated as well that his pain was a 5-6 on a pain scale of 1-10 and still was not feeling good. We have no available appointments for the next two days. Patient was informed that he needed to go to the nearest ER due to severity of symptoms and how bad the patient feels. Patient informed and understood verbally.

## 2022-08-13 DIAGNOSIS — K3532 Acute appendicitis with perforation and localized peritonitis, without abscess: Secondary | ICD-10-CM | POA: Insufficient documentation

## 2022-08-28 DIAGNOSIS — K3532 Acute appendicitis with perforation and localized peritonitis, without abscess: Secondary | ICD-10-CM | POA: Insufficient documentation

## 2022-09-26 ENCOUNTER — Ambulatory Visit: Payer: Self-pay | Admitting: Physician Assistant

## 2022-09-26 ENCOUNTER — Ambulatory Visit: Payer: Self-pay | Admitting: Nurse Practitioner

## 2022-10-27 ENCOUNTER — Ambulatory Visit: Payer: Self-pay | Admitting: Nurse Practitioner

## 2023-04-27 ENCOUNTER — Ambulatory Visit: Payer: Self-pay

## 2023-04-27 VITALS — BP 120/78 | HR 86 | Temp 97.8°F | Resp 16 | Ht 74.0 in | Wt 320.4 lb

## 2023-04-27 DIAGNOSIS — K219 Gastro-esophageal reflux disease without esophagitis: Secondary | ICD-10-CM

## 2023-04-27 DIAGNOSIS — E782 Mixed hyperlipidemia: Secondary | ICD-10-CM | POA: Insufficient documentation

## 2023-04-27 DIAGNOSIS — Z6841 Body Mass Index (BMI) 40.0 and over, adult: Secondary | ICD-10-CM

## 2023-04-27 DIAGNOSIS — E66813 Obesity, class 3: Secondary | ICD-10-CM | POA: Insufficient documentation

## 2023-04-27 DIAGNOSIS — E119 Type 2 diabetes mellitus without complications: Secondary | ICD-10-CM | POA: Insufficient documentation

## 2023-04-27 DIAGNOSIS — F419 Anxiety disorder, unspecified: Secondary | ICD-10-CM

## 2023-04-27 MED ORDER — ESCITALOPRAM OXALATE 5 MG PO TABS: 7.5000 mg | ORAL_TABLET | Freq: Every day | ORAL | 1 refills | Status: DC

## 2023-04-27 MED ORDER — LANSOPRAZOLE 15 MG PO CPDR: 15.0000 mg | DELAYED_RELEASE_CAPSULE | Freq: Every day | ORAL | 1 refills | Status: DC

## 2023-04-27 NOTE — Assessment & Plan Note (Addendum)
Diabetes well-controlled with an A1c of 6.0 last year. Currently on metformin 500 mg once daily. Blood work needed to assess current control. Discussed metformin benefits, including weight loss and cardiovascular protection. If A1c is significantly lower, may consider reducing or discontinuing metformin with aggressive lifestyle changes. - Order HbA1c - Hold off on metformin refill until blood work results are available  Needs routine health maintenance including blood work and urine tests. Has not had a recent eye exam, important due to diabetes. Discussed importance of checking for diabetic retinopathy and proteinuria. - Order blood work to check blood sugars, kidney and liver function, and cholesterol - Order urine test to check for proteinuria - Recommend dilated eye exam

## 2023-04-27 NOTE — Assessment & Plan Note (Signed)
BMI is 41.14, indicating obesity. Acknowledges need for weight loss to improve overall health. Discussed benefits of weight loss on blood pressure, blood sugar control, and cholesterol levels. - Encourage healthy diet and regular exercise

## 2023-04-27 NOTE — Assessment & Plan Note (Signed)
Taking atorvastatin 10 mg daily instead of the prescribed 20 mg. No recent blood work to assess cholesterol levels. Discussed waiting for blood work results before deciding on atorvastatin dosage. If cholesterol is well-controlled, may continue with 10 mg; if not, may need to increase to 20 mg. - Order lipid panel - Hold off on atorvastatin refill until blood work results are available

## 2023-04-27 NOTE — Assessment & Plan Note (Signed)
Previously managed with Prozac. Currently on Lexapro 7.5 mg daily, which is effective. Experiences anxiety in crowded places and occasionally at the doctor's office. Discussed option of increasing to 10 mg if needed. Explained that lorazepam is not preferred due to sedative effects and potential for dependency. - Prescribe Lexapro 5 mg tablets, 135 tablets for 90 days, instruct to take 1.5 tablets daily - Advise against regular use of lorazepam, consider non-benzodiazepine options if needed. He still has 31 pills left in the bottle.

## 2023-04-27 NOTE — Assessment & Plan Note (Signed)
Managed with lansoprazole. Experiences symptoms if medication is stopped. Discussed importance of weight loss in managing GERD symptoms. - Refill lansoprazole prescription

## 2023-04-27 NOTE — Progress Notes (Signed)
Subjective:  Patient ID: Eric [name], male    DOB: [birth year]  Age: 42 y.o. MRN: [id number]  Chief Complaint  Patient presents with   Medical Management of Chronic Issues    HPI   The patient, with a history of high cholesterol, anxiety, and type 2 diabetes, presents for a routine follow-up and prescription renewal. He reports a significant medical event since his last visit in October 2023, a perforated appendicitis in May 2024, which required emergency surgery. The patient reports that the surgery went well and he has since recovered.  The patient has been taking a reduced dose of atorvastatin (10mg  instead of the prescribed 20mg ) for his high cholesterol. He has also been managing his anxiety with Lexapro, initially starting at 5mg  and gradually increasing to 7.5mg  daily. The patient reports that this dosage seems to be effective most of the time, but he still experiences anxiety in crowded places and during medical appointments. He also reports occasional symptoms of nausea and sweaty palms when anxious.  For his type 2 diabetes, the patient has been taking metformin 500mg  once daily, instead of the prescribed twice daily. He reports that his blood sugar levels have been well-controlled, with his last A1C at 6.9. The patient also mentions a family history of blood sugar issues.  The patient has been taking lansoprazole for acid reflux, which he reports becomes problematic if he discontinues the medication. He also has a prescription for lorazepam, which he rarely takes due to its sedative effects. The patient expresses a desire to try a different medication for his anxiety.  The patient does not have insurance but is part of a cost-share program, [employer], which covers hospital and emergency expenses. He pays out-of-pocket for doctor's visits, prescriptions, and blood work. The patient acknowledges the need for updated blood work and is agreeable to a urine test to  check for protein leakage. He also expresses a willingness to adjust his medication regimen based on the results of the blood work.  31 pills of lorazepam left.       04/27/2023    8:42 AM 09/17/2021    9:38 AM 06/17/2021    3:04 PM 01/09/2021    1:59 PM 01/16/2020    8:16 AM  Depression screen PHQ 2/9  Decreased Interest 0 0 2 0 0  Down, Depressed, Hopeless 0 0 2 0 0  PHQ - 2 Score 0 0 4 0 0  Altered sleeping 0 0 3  0  Tired, decreased energy 0 0 2  0  Change in appetite 0 0 3  0  Feeling bad or failure about yourself  0 0 2  0  Trouble concentrating 0 0 1  0  Moving slowly or fidgety/restless 0 0 0  0  Suicidal thoughts 0 0 1  0  PHQ-9 Score 0 0 16  0  Difficult doing work/chores Not difficult at all Not difficult at all Not difficult at all  Not difficult at all        04/27/2023    8:42 AM  Fall Risk   Falls in the past year? 0  Number falls in past yr: 0  Injury with Fall? 0  Risk for fall due to : No Fall Risks  Follow up Falls evaluation completed    Patient Care Team: Abigail Miyamoto, MD (Inactive) as PCP - General (Family Medicine)   Review of Systems  Respiratory: Negative.    Cardiovascular: Negative.   Gastrointestinal: Negative.  All other systems reviewed and are negative.   Current Outpatient Medications on File Prior to Visit  Medication Sig Dispense Refill   atorvastatin (LIPITOR) 20 MG tablet Take 1 tablet (20 mg total) by mouth daily. 90 tablet 3   LORazepam (ATIVAN) 0.5 MG tablet Take 1 tablet (0.5 mg total) by mouth daily as needed. for anxiety (Patient not taking: Reported on 04/27/2023) 30 tablet 3   metFORMIN (GLUCOPHAGE) 500 MG tablet Take 1 tablet (500 mg total) by mouth 2 (two) times daily with a meal. 180 tablet 3   No current facility-administered medications on file prior to visit.   Past Medical History:  Diagnosis Date   Anxiety    GERD (gastroesophageal reflux disease)    Tibial plateau fracture, right 08/30/2015   Past  Surgical History:  Procedure Laterality Date   ORIF TIBIA PLATEAU Right 09/03/2015   Procedure: OPEN REDUCTION INTERNAL FIXATION (ORIF) TIBIAL PLATEAU, POSSIBLE REAMED INTRAMEDULLARY ASPIRATE OR ILIAC CREST GRAFTING;  Surgeon: Myrene Galas, MD;  Location: [facility] OR;  Service: Orthopedics;  Laterality: Right;   TONSILLECTOMY AND ADENOIDECTOMY      History reviewed. No pertinent family history. Social History   Socioeconomic History   Marital status: Married    Spouse name: Not on file   Number of children: Not on file   Years of education: Not on file   Highest education level: Not on file  Occupational History   Occupation: farmer   Tobacco Use   Smoking status: Never   Smokeless tobacco: Former   Tobacco comments:    3-4 cans dip/week   Substance and Sexual Activity   Alcohol use: No    Alcohol/week: 0.0 standard drinks of alcohol   Drug use: No   Sexual activity: Not on file  Other Topics Concern   Not on file  Social History Narrative   Not on file   Social Determinants of Health   Financial Resource Strain: Not on file  Food Insecurity: Not on file  Transportation Needs: Not on file  Physical Activity: Not on file  Stress: Not on file  Social Connections: Not on file    Objective:  BP 120/78 (BP Location: Left Arm, Patient Position: Sitting, Cuff Size: Large)   Pulse 86   Temp 97.8 F (36.6 C) (Temporal)   Resp 16   Ht 6\' 2"  (1.88 m)   Wt (!) 320 lb 6.4 oz (145.3 kg)   SpO2 96%   BMI 41.14 kg/m      04/27/2023    8:42 AM 03/19/2022    9:02 AM 09/17/2021    9:19 AM  BP/Weight  Systolic BP 120 110 102  Diastolic BP 78 88 80  Wt. (Lbs) 320.4 308 291  BMI 41.14 kg/m2 39.54 kg/m2 37.36 kg/m2    Physical Exam Vitals and nursing note reviewed.  Constitutional:      Appearance: Normal appearance.  HENT:     Head: Normocephalic and atraumatic.     Mouth/Throat:     Mouth: Mucous membranes are moist.     Pharynx: Oropharynx is clear.  Eyes:      Extraocular Movements: Extraocular movements intact.     Pupils: Pupils are equal, round, and reactive to light.  Cardiovascular:     Rate and Rhythm: Normal rate and regular rhythm.  Pulmonary:     Effort: Pulmonary effort is normal.     Breath sounds: Normal breath sounds.  Musculoskeletal:        General: Normal range of motion.  Skin:    General: Skin is warm and dry.  Neurological:     General: No focal deficit present.     Mental Status: He is alert and oriented to person, place, and time. Mental status is at baseline.  Psychiatric:        Mood and Affect: Mood normal.        Behavior: Behavior normal.     Diabetic Foot Exam - Simple   No data filed      Lab Results  Component Value Date   WBC 3.7 09/17/2021   HGB 15.7 09/17/2021   HCT 45.3 09/17/2021   PLT 234 09/17/2021   GLUCOSE 117 (H) 09/17/2021   CHOL 191 03/19/2022   TRIG 104 03/19/2022   HDL 48 03/19/2022   LDLCALC 124 (H) 03/19/2022   ALT 29 09/17/2021   AST 40 09/17/2021   NA 137 09/17/2021   K 4.4 09/17/2021   CL 102 09/17/2021   CREATININE 0.96 09/17/2021   BUN 15 09/17/2021   CO2 21 09/17/2021   INR 1.03 09/03/2015   HGBA1C 6.0 (H) 03/19/2022      Assessment & Plan:    Mixed hyperlipidemia Assessment & Plan: Taking atorvastatin 10 mg daily instead of the prescribed 20 mg. No recent blood work to assess cholesterol levels. Discussed waiting for blood work results before deciding on atorvastatin dosage. If cholesterol is well-controlled, may continue with 10 mg; if not, may need to increase to 20 mg. - Order lipid panel - Hold off on atorvastatin refill until blood work results are available   Orders: -     Hemoglobin A1c -     Comprehensive metabolic panel -     Lipid panel -     Microalbumin / creatinine urine ratio  Anxiety Assessment & Plan: Previously managed with Prozac. Currently on Lexapro 7.5 mg daily, which is effective. Experiences anxiety in crowded places and  occasionally at the doctor's office. Discussed option of increasing to 10 mg if needed. Explained that lorazepam is not preferred due to sedative effects and potential for dependency. - Prescribe Lexapro 5 mg tablets, 135 tablets for 90 days, instruct to take 1.5 tablets daily - Advise against regular use of lorazepam, consider non-benzodiazepine options if needed. He still has 31 pills left in the bottle.    Orders: -     Escitalopram Oxalate; Take 1.5 tablets (7.5 mg total) by mouth daily.  Dispense: 135 tablet; Refill: 1 -     Hemoglobin A1c -     Comprehensive metabolic panel -     Lipid panel -     Microalbumin / creatinine urine ratio  Controlled type 2 diabetes mellitus without complication, without long-term current use of insulin (HCC) Assessment & Plan: Diabetes well-controlled with an A1c of 6.0 last year. Currently on metformin 500 mg once daily. Blood work needed to assess current control. Discussed metformin benefits, including weight loss and cardiovascular protection. If A1c is significantly lower, may consider reducing or discontinuing metformin with aggressive lifestyle changes. - Order HbA1c - Hold off on metformin refill until blood work results are available  Needs routine health maintenance including blood work and urine tests. Has not had a recent eye exam, important due to diabetes. Discussed importance of checking for diabetic retinopathy and proteinuria. - Order blood work to check blood sugars, kidney and liver function, and cholesterol - Order urine test to check for proteinuria - Recommend dilated eye exam  Orders: -     Hemoglobin A1c -  Comprehensive metabolic panel -     Lipid panel -     Microalbumin / creatinine urine ratio  Gastroesophageal reflux disease, unspecified whether esophagitis present Assessment & Plan: Managed with lansoprazole. Experiences symptoms if medication is stopped. Discussed importance of weight loss in managing GERD  symptoms. - Refill lansoprazole prescription   Orders: -     Hemoglobin A1c -     Comprehensive metabolic panel -     Lipid panel -     Microalbumin / creatinine urine ratio  Class 3 severe obesity due to excess calories with serious comorbidity and body mass index (BMI) of 40.0 to 44.9 in adult [facility]) Assessment & Plan: BMI is 41.14, indicating obesity. Acknowledges need for weight loss to improve overall health. Discussed benefits of weight loss on blood pressure, blood sugar control, and cholesterol levels. - Encourage healthy diet and regular exercise    Other orders -     Lansoprazole; Take 1 capsule (15 mg total) by mouth daily.  Dispense: 90 capsule; Refill: 1     Meds ordered this encounter  Medications   escitalopram (LEXAPRO) 5 MG tablet    Sig: Take 1.5 tablets (7.5 mg total) by mouth daily.    Dispense:  135 tablet    Refill:  1   lansoprazole (PREVACID) 15 MG capsule    Sig: Take 1 capsule (15 mg total) by mouth daily.    Dispense:  90 capsule    Refill:  1    Orders Placed This Encounter  Procedures   Hemoglobin A1c   Comprehensive metabolic panel   Lipid panel   Microalbumin / creatinine urine ratio     Follow-up: Return in about 6 months (around 10/26/2023) for chronic disease follow up.   I,Angela Taylor,acting as a Neurosurgeon for [employer], MD.,have documented all relevant documentation on the behalf of Windell Moment, MD,as directed by  Windell Moment, MD while in the presence of Windell Moment, MD.   An After Visit Summary was printed and given to the patient.  Windell Moment, MD [facility] [phone]

## 2023-04-27 NOTE — Patient Instructions (Signed)
Patient Visit Summary VISIT SUMMARY:  During your visit today, we reviewed your history of high cholesterol, anxiety, and type 2 diabetes. We also discussed your recent recovery from perforated appendicitis surgery. We addressed your current medications and made plans for necessary blood work and adjustments to your treatment regimen.  YOUR PLAN:  -TYPE 2 DIABETES MELLITUS: Type 2 diabetes is a condition where your body does not use insulin properly, leading to high blood sugar levels. Your diabetes appears well-controlled with an A1c of 6.9. We will order blood work to assess your current control and decide whether to adjust your metformin dosage based on the results.  -HYPERLIPIDEMIA: Hyperlipidemia means having high levels of cholesterol in your blood, which can increase the risk of heart disease. You are currently taking a lower dose of atorvastatin than prescribed. We will wait for your blood work results to determine if the dosage needs to be adjusted.  -OBESITY: Obesity is a condition characterized by excessive body fat, which can impact your overall health. Your BMI is 41.14. We discussed the importance of weight loss through a healthy diet and regular exercise to improve your blood pressure, blood sugar, and cholesterol levels.  -GASTROESOPHAGEAL REFLUX DISEASE (GERD): GERD is a condition where stomach acid frequently flows back into the tube connecting your mouth and stomach, causing discomfort. You are managing this with lansoprazole, and we will continue this medication as it helps control your symptoms.  -GENERALIZED ANXIETY DISORDER: Generalized anxiety disorder is a condition characterized by excessive worry and anxiety. You are currently taking Lexapro, which is effective most of the time. We discussed the option of increasing your dose if needed and advised against regular use of lorazepam due to its sedative effects and potential for dependency.  -GENERAL HEALTH MAINTENANCE:  Routine health maintenance is important for overall well-being. We will conduct blood work to check your blood sugars, kidney and liver function, and cholesterol levels, as well as a urine test to check for proteinuria. We also recommend a dilated eye exam to check for diabetic retinopathy.  INSTRUCTIONS:  Please complete the ordered blood work and urine test as soon as possible. Schedule a dilated eye exam to check for diabetic retinopathy. We will hold off on refilling your metformin and atorvastatin until we have the blood work results. Please schedule a follow-up appointment in 6 months.

## 2023-04-28 LAB — COMPREHENSIVE METABOLIC PANEL
ALT: 47 [IU]/L — ABNORMAL HIGH (ref 0–44)
AST: 39 [IU]/L (ref 0–40)
Albumin: 4.7 g/dL (ref 4.1–5.1)
Alkaline Phosphatase: 83 [IU]/L (ref 44–121)
BUN/Creatinine Ratio: 13 (ref 9–20)
BUN: 14 mg/dL (ref 6–24)
Bilirubin Total: 3 mg/dL — ABNORMAL HIGH (ref 0.0–1.2)
CO2: 23 mmol/L (ref 20–29)
Calcium: 9.7 mg/dL (ref 8.7–10.2)
Chloride: 100 mmol/L (ref 96–106)
Creatinine, Ser: 1.09 mg/dL (ref 0.76–1.27)
Globulin, Total: 2.6 g/dL (ref 1.5–4.5)
Glucose: 124 mg/dL — ABNORMAL HIGH (ref 70–99)
Potassium: 4.8 mmol/L (ref 3.5–5.2)
Sodium: 140 mmol/L (ref 134–144)
Total Protein: 7.3 g/dL (ref 6.0–8.5)
eGFR: 86 mL/min/{1.73_m2} (ref >59)

## 2023-04-28 LAB — MICROALBUMIN / CREATININE URINE RATIO
Creatinine, Urine: 143.1 mg/dL
Microalb/Creat Ratio: 6 mg/g{creat} (ref 0–29)
Microalbumin, Urine: 9.3 ug/mL

## 2023-04-28 LAB — HEMOGLOBIN A1C
Est. average glucose Bld gHb Est-mCnc: 137 mg/dL
Hgb A1c MFr Bld: 6.4 % — ABNORMAL HIGH (ref 4.8–5.6)

## 2023-04-28 LAB — LIPID PANEL
Chol/HDL Ratio: 2.6 {ratio} (ref 0.0–5.0)
Cholesterol, Total: 129 mg/dL (ref 100–199)
HDL: 50 mg/dL (ref >39)
LDL Chol Calc (NIH): 60 mg/dL (ref 0–99)
Triglycerides: 104 mg/dL (ref 0–149)
VLDL Cholesterol Cal: 19 mg/dL (ref 5–40)

## 2023-05-21 ENCOUNTER — Other Ambulatory Visit: Payer: Self-pay

## 2023-05-21 DIAGNOSIS — E119 Type 2 diabetes mellitus without complications: Secondary | ICD-10-CM

## 2023-05-22 ENCOUNTER — Other Ambulatory Visit: Payer: Self-pay

## 2023-05-22 ENCOUNTER — Telehealth: Payer: Self-pay

## 2023-05-22 DIAGNOSIS — E119 Type 2 diabetes mellitus without complications: Secondary | ICD-10-CM

## 2023-05-22 DIAGNOSIS — E782 Mixed hyperlipidemia: Secondary | ICD-10-CM

## 2023-05-22 MED ORDER — ATORVASTATIN CALCIUM 20 MG PO TABS: 20.0000 mg | ORAL_TABLET | Freq: Every day | ORAL | 3 refills | Status: DC

## 2023-05-22 MED ORDER — METFORMIN HCL 500 MG PO TABS: 500.0000 mg | ORAL_TABLET | Freq: Two times a day (BID) | ORAL | 3 refills | Status: DC

## 2023-05-22 NOTE — Telephone Encounter (Signed)
Called patient, his labs has not been resulted by provider, he will need refills for medications. Please advise

## 2023-05-22 NOTE — Telephone Encounter (Signed)
Copied from CRM [id number]. Topic: Clinical - Medication Question >> May 22, 2023 11:16 AM Hector Shade B wrote: Reason for CRM: Patient called in regard to his medication Metformin 500 mg and Atorvastatin 20mg , he wanted to know should he continue on the Metformin and the Atorvastatin, also he would like someone to call him back in regard to his lab work.  His number is [phone]

## 2023-05-25 ENCOUNTER — Other Ambulatory Visit: Payer: Self-pay

## 2023-05-25 DIAGNOSIS — E782 Mixed hyperlipidemia: Secondary | ICD-10-CM

## 2023-05-25 DIAGNOSIS — E119 Type 2 diabetes mellitus without complications: Secondary | ICD-10-CM

## 2023-05-25 MED ORDER — ATORVASTATIN CALCIUM 20 MG PO TABS: 20.0000 mg | ORAL_TABLET | Freq: Every day | ORAL | 3 refills | Status: AC

## 2023-05-25 MED ORDER — METFORMIN HCL 500 MG PO TABS: 500.0000 mg | ORAL_TABLET | Freq: Two times a day (BID) | ORAL | 3 refills | Status: AC

## 2023-06-01 ENCOUNTER — Other Ambulatory Visit: Payer: Self-pay

## 2023-10-29 ENCOUNTER — Ambulatory Visit: Payer: Self-pay

## 2023-11-02 ENCOUNTER — Ambulatory Visit: Payer: Self-pay

## 2023-11-02 VITALS — BP 128/80 | HR 77 | Temp 97.6°F | Ht 74.0 in | Wt 322.8 lb

## 2023-11-02 DIAGNOSIS — F419 Anxiety disorder, unspecified: Secondary | ICD-10-CM

## 2023-11-02 DIAGNOSIS — E119 Type 2 diabetes mellitus without complications: Secondary | ICD-10-CM

## 2023-11-02 DIAGNOSIS — K219 Gastro-esophageal reflux disease without esophagitis: Secondary | ICD-10-CM

## 2023-11-02 DIAGNOSIS — E782 Mixed hyperlipidemia: Secondary | ICD-10-CM

## 2023-11-02 MED ORDER — LANSOPRAZOLE 15 MG PO CPDR: 15.0000 mg | DELAYED_RELEASE_CAPSULE | Freq: Every day | ORAL | 1 refills | Status: AC

## 2023-11-02 MED ORDER — ESCITALOPRAM OXALATE 5 MG PO TABS: 7.5000 mg | ORAL_TABLET | Freq: Every day | ORAL | 1 refills | Status: DC

## 2023-11-02 NOTE — Assessment & Plan Note (Signed)
 well-managed with Lexapro , indicated by a score of zero on the depression scale. He is taking 7.5 mg of Lexapro  daily, effectively managing symptoms. Regular follow-up and medication adherence are important to maintain mental health. - Continue Lexapro  7.5 mg daily - Refill Lexapro  prescription

## 2023-11-02 NOTE — Assessment & Plan Note (Signed)
 Type 2 Diabetes Mellitus is well-controlled with an A1c of 6.4, below the target of 7.0. He is taking 500 mg of metformin  daily, effectively maintaining glycemic control. Regular blood work every six months is emphasized due to significant cardiac risk factors, including strokes and heart attacks. Lifestyle changes, including diet and exercise, were discussed to further manage diabetes. - Order blood work including CBC, INS, metabolic profile, A1c, and urine analysis - Continue metformin  500 mg daily - Encourage dietary changes and regular exercise

## 2023-11-02 NOTE — Assessment & Plan Note (Signed)
 Takes prevacid  as needed, not daily. Refilled today

## 2023-11-02 NOTE — Patient Instructions (Signed)
  VISIT SUMMARY: Today, we reviewed your diabetes, cholesterol, and overall health. Your diabetes is well-controlled, and your cholesterol levels have improved. We discussed the importance of diet and exercise in managing your health. We also talked about your liver health and mental well-being.  YOUR PLAN: TYPE 2 DIABETES MELLITUS: Your diabetes is well-controlled with an A1c of 6.4, which is below the target of 7.0. -Continue taking metformin  500 mg daily. -Order blood work including CBC, INS, metabolic profile, A1c, and urine analysis. -Encourage dietary changes and regular exercise.  HYPERLIPIDEMIA: Your cholesterol levels have improved with the current dose of atorvastatin . -Continue taking atorvastatin  10 mg daily. -Monitor cholesterol levels in upcoming blood work.  FATTY LIVER DISEASE: Your liver enzymes have normalized, but monitoring is still important. -Monitor liver enzymes in upcoming blood work.  DEPRESSION: Your depression is well-managed with Lexapro . -Continue taking Lexapro  7.5 mg daily. -Refill Lexapro  prescription.  GENERAL HEALTH MAINTENANCE: We discussed improving your diet and increasing physical activity. -Encourage dietary changes, such as reducing calorie intake from snacks. -Encourage regular exercise, including strength training and cardiovascular activities. -Encourage regular eye exams.  FOLLOW-UP: Regular follow-up is crucial to monitor your health conditions. -Schedule follow-up appointment in six months. -Call with test results.                      Contains text generated by [employer].                                 Contains text generated by [employer].

## 2023-11-02 NOTE — Progress Notes (Signed)
 Subjective:  Patient ID: Eric [name], male    DOB: 30-Apr-1981  Age: 43 y.o. MRN: [id number]  Chief Complaint  Patient presents with   Medical Management of Chronic Issues    HPI: Discussed the use of AI scribe software for clinical note transcription with the patient, who gave verbal consent to proceed.  History of Present Illness   Eric [name] is a 43 year old male with diabetes and hyperlipidemia who presents for a routine follow-up.  He is currently taking 500 mg of metformin  once daily and 10 mg of atorvastatin , which is half of the originally prescribed dose. He decided to take a lower dose of atorvastatin  in an attempt to manage his cholesterol through diet, although he has not made significant changes. He also takes 7.5 mg of Lexapro  daily and uses Prevacid  15 mg as needed.  His blood pressure is stable, but his weight has increased slightly since December. His last blood work in December showed good kidney function with a filtration rate of 86, and normal electrolytes. His liver enzymes, which were elevated in December 2022, had normalized by April 2023, although one enzyme level increased from 29 to 47, slightly above the normal cutoff of 44. His cholesterol levels have improved, and his A1c was 6.4 in December, up from 6.0 in October 2023. He prefers blood work every six months rather than annually.  He acknowledges that his diet could be improved, consuming three to five chocolate chip cookies with milk nightly. He is aware of the caloric content of this habit and wants to make healthier choices. He describes himself as active but not currently engaging in structured exercise. He recalls a time when he was biking regularly and had lost weight.  No falls, tingling, or numbness in his feet. He feels good most of the time, with no significant issues with snoring or daytime sleepiness. He has not had a recent eye exam but acknowledges the need for one.  He is a parent of teenagers  and works two jobs, which influences his dietary choices, often opting for convenience foods. He wants to improve his lifestyle habits, including diet and exercise.         11/02/2023    8:08 AM 04/27/2023    8:42 AM 09/17/2021    9:38 AM 06/17/2021    3:04 PM 01/09/2021    1:59 PM  Depression screen PHQ 2/9  Decreased Interest 0 0 0 2 0  Down, Depressed, Hopeless 0 0 0 2 0  PHQ - 2 Score 0 0 0 4 0  Altered sleeping 0 0 0 3   Tired, decreased energy 0 0 0 2   Change in appetite 0 0 0 3   Feeling bad or failure about yourself  0 0 0 2   Trouble concentrating 0 0 0 1   Moving slowly or fidgety/restless 0 0 0 0   Suicidal thoughts 0 0 0 1   PHQ-9 Score 0 0 0 16   Difficult doing work/chores Not difficult at all Not difficult at all Not difficult at all Not difficult at all         11/02/2023    8:08 AM  Fall Risk   Number falls in past yr: 0  Injury with Fall? 0  Risk for fall due to : No Fall Risks    Patient Care Team: Rayce Brahmbhatt, MD as PCP - General (Family Medicine)   Review of Systems  Constitutional:  Negative for chills, fatigue and  fever.  HENT:  Negative for congestion, ear pain, sinus pressure and sore throat.   Respiratory:  Negative for cough and shortness of breath.   Cardiovascular:  Negative for chest pain.  Gastrointestinal:  Negative for abdominal pain, constipation, diarrhea, nausea and vomiting.  Genitourinary:  Negative for dysuria and frequency.  Musculoskeletal:  Negative for arthralgias, back pain and myalgias.  Neurological:  Negative for dizziness and headaches.  Psychiatric/Behavioral:  Negative for dysphoric mood. The patient is not nervous/anxious.     Current Outpatient Medications on File Prior to Visit  Medication Sig Dispense Refill   atorvastatin  (LIPITOR) 20 MG tablet Take 1 tablet (20 mg total) by mouth daily. 90 tablet 3   LORazepam  (ATIVAN ) 0.5 MG tablet Take 1 tablet (0.5 mg total) by mouth daily as needed. for anxiety 30 tablet 3    metFORMIN  (GLUCOPHAGE ) 500 MG tablet Take 1 tablet (500 mg total) by mouth 2 (two) times daily with a meal. 180 tablet 3   No current facility-administered medications on file prior to visit.   Past Medical History:  Diagnosis Date   Anxiety    GERD (gastroesophageal reflux disease)    Tibial plateau fracture, right 08/30/2015   Past Surgical History:  Procedure Laterality Date   ORIF TIBIA PLATEAU Right 09/03/2015   Procedure: OPEN REDUCTION INTERNAL FIXATION (ORIF) TIBIAL PLATEAU, POSSIBLE REAMED INTRAMEDULLARY ASPIRATE OR ILIAC CREST GRAFTING;  Surgeon: Hardy Lia, MD;  Location: [facility] OR;  Service: Orthopedics;  Laterality: Right;   TONSILLECTOMY AND ADENOIDECTOMY      History reviewed. No pertinent family history. Social History   Socioeconomic History   Marital status: Married    Spouse name: Not on file   Number of children: Not on file   Years of education: Not on file   Highest education level: Not on file  Occupational History   Occupation: farmer   Tobacco Use   Smoking status: Never   Smokeless tobacco: Current    Types: Snuff   Tobacco comments:    3-4 cans dip/week   Substance and Sexual Activity   Alcohol use: No    Alcohol/week: 0.0 standard drinks of alcohol   Drug use: No   Sexual activity: Not on file  Other Topics Concern   Not on file  Social History Narrative   Not on file   Social Drivers of Health   Financial Resource Strain: Low Risk  (11/02/2023)   Overall Financial Resource Strain (CARDIA)    Difficulty of Paying Living Expenses: Not hard at all  Food Insecurity: No Food Insecurity (11/02/2023)   Hunger Vital Sign    Worried About Running Out of Food in the Last Year: Never true    Ran Out of Food in the Last Year: Never true  Transportation Needs: No Transportation Needs (11/02/2023)   PRAPARE - Administrator, Civil Service (Medical): No    Lack of Transportation (Non-Medical): No  Physical Activity: Inactive (11/02/2023)    Exercise Vital Sign    Days of Exercise per Week: 0 days    Minutes of Exercise per Session: 0 min  Stress: No Stress Concern Present (11/02/2023)   [employer] of Occupational Health - Occupational Stress Questionnaire    Feeling of Stress : Not at all  Social Connections: Moderately Integrated (11/02/2023)   Social Connection and Isolation Panel [NHANES]    Frequency of Communication with Friends and Family: More than three times a week    Frequency of Social Gatherings with Friends and  Family: More than three times a week    Attends Religious Services: More than 4 times per year    Active Member of Clubs or Organizations: No    Attends Banker Meetings: Never    Marital Status: Married    Objective:  BP 128/80   Pulse 77   Temp 97.6 F (36.4 C)   Ht 6\' 2"  (1.88 m)   Wt (!) 322 lb 12.8 oz (146.4 kg)   SpO2 98%   BMI 41.45 kg/m      11/02/2023    8:05 AM 04/27/2023    8:42 AM 03/19/2022    9:02 AM  BP/Weight  Systolic BP 128 120 110  Diastolic BP 80 78 88  Wt. (Lbs) 322.8 320.4 308  BMI 41.45 kg/m2 41.14 kg/m2 39.54 kg/m2    Physical Exam Vitals and nursing note reviewed.  Constitutional:      Appearance: He is obese.  HENT:     Head: Normocephalic and atraumatic.  Cardiovascular:     Rate and Rhythm: Normal rate and regular rhythm.  Pulmonary:     Effort: Pulmonary effort is normal.     Breath sounds: Normal breath sounds.  Musculoskeletal:        General: Normal range of motion.  Skin:    General: Skin is warm.  Neurological:     General: No focal deficit present.     Mental Status: He is alert.  Psychiatric:        Mood and Affect: Mood normal.     Diabetic Foot Exam - Simple   Simple Foot Form Visual Inspection No deformities, no ulcerations, no other skin breakdown bilaterally: Yes Sensation Testing Intact to touch and monofilament testing bilaterally: Yes Pulse Check Posterior Tibialis and Dorsalis pulse intact bilaterally:  Yes Comments      Lab Results  Component Value Date   WBC 3.7 09/17/2021   HGB 15.7 09/17/2021   HCT 45.3 09/17/2021   PLT 234 09/17/2021   GLUCOSE 124 (H) 04/27/2023   CHOL 129 04/27/2023   TRIG 104 04/27/2023   HDL 50 04/27/2023   LDLCALC 60 04/27/2023   ALT 47 (H) 04/27/2023   AST 39 04/27/2023   NA 140 04/27/2023   K 4.8 04/27/2023   CL 100 04/27/2023   CREATININE 1.09 04/27/2023   BUN 14 04/27/2023   CO2 23 04/27/2023   INR 1.03 09/03/2015   HGBA1C 6.4 (H) 04/27/2023      Assessment & Plan:  Controlled type 2 diabetes mellitus without complication, without long-term current use of insulin (HCC) Assessment & Plan: Type 2 Diabetes Mellitus is well-controlled with an A1c of 6.4, below the target of 7.0. He is taking 500 mg of metformin  daily, effectively maintaining glycemic control. Regular blood work every six months is emphasized due to significant cardiac risk factors, including strokes and heart attacks. Lifestyle changes, including diet and exercise, were discussed to further manage diabetes. - Order blood work including CBC, INS, metabolic profile, A1c, and urine analysis - Continue metformin  500 mg daily - Encourage dietary changes and regular exercise  Orders: -     Hemoglobin A1c -     Microalbumin / creatinine urine ratio  Mixed hyperlipidemia Assessment & Plan: Hyperlipidemia is managed with atorvastatin , improving the cholesterol panel. He is taking 10 mg of atorvastatin  daily, half of the originally prescribed dose. Lifestyle changes in conjunction with medication were discussed to manage cholesterol levels. Regular monitoring is necessary to prevent cardiovascular complications. - Continue atorvastatin   10 mg daily - Monitor cholesterol levels in upcoming blood work  Orders: -     Comprehensive metabolic panel with GFR -     Lipid panel  Gastroesophageal reflux disease, unspecified whether esophagitis present Assessment & Plan: Takes prevacid  as  needed, not daily. Refilled today   Anxiety Assessment & Plan:  well-managed with Lexapro , indicated by a score of zero on the depression scale. He is taking 7.5 mg of Lexapro  daily, effectively managing symptoms. Regular follow-up and medication adherence are important to maintain mental health. - Continue Lexapro  7.5 mg daily - Refill Lexapro  prescription  Orders: -     Escitalopram  Oxalate; Take 1.5 tablets (7.5 mg total) by mouth daily.  Dispense: 135 tablet; Refill: 1  Other orders -     Lansoprazole ; Take 1 capsule (15 mg total) by mouth daily.  Dispense: 90 capsule; Refill: 1   Assessment and Plan            Meds ordered this encounter  Medications   escitalopram  (LEXAPRO ) 5 MG tablet    Sig: Take 1.5 tablets (7.5 mg total) by mouth daily.    Dispense:  135 tablet    Refill:  1   lansoprazole  (PREVACID ) 15 MG capsule    Sig: Take 1 capsule (15 mg total) by mouth daily.    Dispense:  90 capsule    Refill:  1    Orders Placed This Encounter  Procedures   Comprehensive metabolic panel with GFR   Lipid Panel   Hemoglobin A1c   Microalbumin/Creatinine Ratio, Urine     Follow-up: Return in about 6 months (around 05/03/2024) for chronic disease follow up.  An After Visit Summary was printed and given to the patient.  Georjean Toya, MD [facility] [phone]

## 2023-11-02 NOTE — Assessment & Plan Note (Signed)
 Hyperlipidemia is managed with atorvastatin , improving the cholesterol panel. He is taking 10 mg of atorvastatin  daily, half of the originally prescribed dose. Lifestyle changes in conjunction with medication were discussed to manage cholesterol levels. Regular monitoring is necessary to prevent cardiovascular complications. - Continue atorvastatin  10 mg daily - Monitor cholesterol levels in upcoming blood work

## 2023-11-03 ENCOUNTER — Ambulatory Visit: Payer: Self-pay

## 2023-11-03 LAB — COMPREHENSIVE METABOLIC PANEL WITH GFR
ALT: 42 IU/L (ref 0–44)
AST: 35 IU/L (ref 0–40)
Albumin: 4.6 g/dL (ref 4.1–5.1)
Alkaline Phosphatase: 83 IU/L (ref 44–121)
BUN/Creatinine Ratio: 14 (ref 9–20)
BUN: 15 mg/dL (ref 6–24)
Bilirubin Total: 1.8 mg/dL — ABNORMAL HIGH (ref 0.0–1.2)
CO2: 20 mmol/L (ref 20–29)
Calcium: 9.5 mg/dL (ref 8.7–10.2)
Chloride: 101 mmol/L (ref 96–106)
Creatinine, Ser: 1.05 mg/dL (ref 0.76–1.27)
Globulin, Total: 2.5 g/dL (ref 1.5–4.5)
Glucose: 144 mg/dL — ABNORMAL HIGH (ref 70–99)
Potassium: 4.6 mmol/L (ref 3.5–5.2)
Sodium: 137 mmol/L (ref 134–144)
Total Protein: 7.1 g/dL (ref 6.0–8.5)
eGFR: 90 mL/min/{1.73_m2} (ref >59)

## 2023-11-03 LAB — LIPID PANEL
Chol/HDL Ratio: 2.7 ratio (ref 0.0–5.0)
Cholesterol, Total: 123 mg/dL (ref 100–199)
HDL: 45 mg/dL (ref >39)
LDL Chol Calc (NIH): 60 mg/dL (ref 0–99)
Triglycerides: 96 mg/dL (ref 0–149)
VLDL Cholesterol Cal: 18 mg/dL (ref 5–40)

## 2023-11-03 LAB — HEMOGLOBIN A1C
Est. average glucose Bld gHb Est-mCnc: 140 mg/dL
Hgb A1c MFr Bld: 6.5 % — ABNORMAL HIGH (ref 4.8–5.6)

## 2023-11-03 LAB — MICROALBUMIN / CREATININE URINE RATIO
Creatinine, Urine: 177.8 mg/dL
Microalb/Creat Ratio: 4 mg/g{creat} (ref 0–29)
Microalbumin, Urine: 7.4 ug/mL

## 2024-05-05 ENCOUNTER — Ambulatory Visit: Payer: Self-pay

## 2024-05-23 ENCOUNTER — Other Ambulatory Visit: Payer: Self-pay

## 2024-05-23 DIAGNOSIS — F419 Anxiety disorder, unspecified: Secondary | ICD-10-CM

## 2024-05-23 MED ORDER — ESCITALOPRAM OXALATE 5 MG PO TABS: 7.5000 mg | ORAL_TABLET | Freq: Every day | ORAL | 0 refills | Status: AC

## 2024-06-13 ENCOUNTER — Ambulatory Visit: Payer: Self-pay

## 2024-07-25 ENCOUNTER — Ambulatory Visit: Payer: Self-pay

## 2024-07-27 ENCOUNTER — Ambulatory Visit: Payer: Self-pay
